# Patient Record
Sex: Male | Born: 1980 | Race: Black or African American | Hispanic: No | Marital: Single | Smoking: Current some day smoker
Health system: Southern US, Community
[De-identification: ages and names within clinical notes are randomized; demographics above are authoritative.]

## PROBLEM LIST (undated history)

## (undated) DIAGNOSIS — M130 Polyarthritis, unspecified: Secondary | ICD-10-CM

## (undated) DIAGNOSIS — L568 Other specified acute skin changes due to ultraviolet radiation: Secondary | ICD-10-CM

## (undated) DIAGNOSIS — M3500 Sicca syndrome, unspecified: Secondary | ICD-10-CM

## (undated) DIAGNOSIS — K1379 Other lesions of oral mucosa: Secondary | ICD-10-CM

## (undated) DIAGNOSIS — M329 Systemic lupus erythematosus, unspecified: Secondary | ICD-10-CM

## (undated) DIAGNOSIS — R5383 Other fatigue: Secondary | ICD-10-CM

## (undated) DIAGNOSIS — I319 Disease of pericardium, unspecified: Secondary | ICD-10-CM

## (undated) DIAGNOSIS — L659 Nonscarring hair loss, unspecified: Secondary | ICD-10-CM

## (undated) DIAGNOSIS — R21 Rash and other nonspecific skin eruption: Secondary | ICD-10-CM

## (undated) DIAGNOSIS — F909 Attention-deficit hyperactivity disorder, unspecified type: Secondary | ICD-10-CM

## (undated) DIAGNOSIS — J45909 Unspecified asthma, uncomplicated: Secondary | ICD-10-CM

## (undated) HISTORY — DX: Sjogren syndrome, unspecified: M35.00

## (undated) HISTORY — DX: Other fatigue: R53.83

## (undated) HISTORY — DX: Unspecified asthma, uncomplicated: J45.909

## (undated) HISTORY — DX: Other lesions of oral mucosa: K13.79

## (undated) HISTORY — DX: Other specified acute skin changes due to ultraviolet radiation: L56.8

## (undated) HISTORY — DX: Rash and other nonspecific skin eruption: R21

## (undated) HISTORY — DX: Nonscarring hair loss, unspecified: L65.9

## (undated) HISTORY — DX: Polyarthritis, unspecified: M13.0

---

## 2007-06-23 ENCOUNTER — Emergency Department (HOSPITAL_COMMUNITY): Admission: EM | Admit: 2007-06-23 | Discharge: 2007-06-23 | Payer: Self-pay | Admitting: Emergency Medicine

## 2009-04-09 IMAGING — CR DG FOOT COMPLETE 3+V*R*
3 series · 3 of 3 positions shown · non-contrast
Comparison: None.

CLINICAL DATA: 26-year-old male with penetrating trauma to the
right foot, glass.

RIGHT FOOT COMPLETE - 3+ VIEW

[view not recorded (1 of 3)]
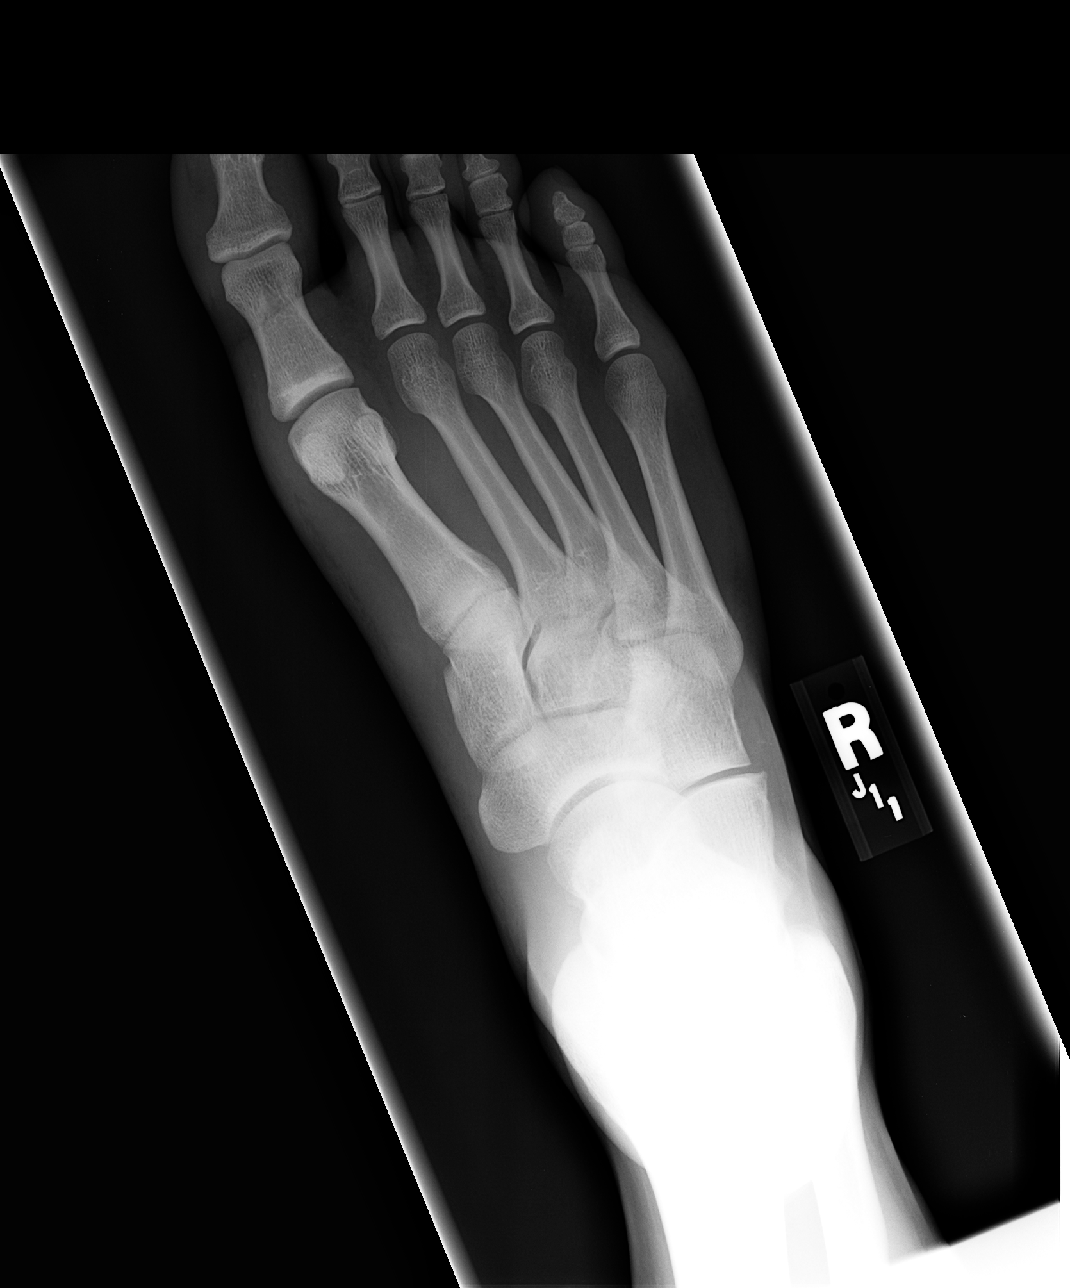

[view not recorded (2 of 3)]
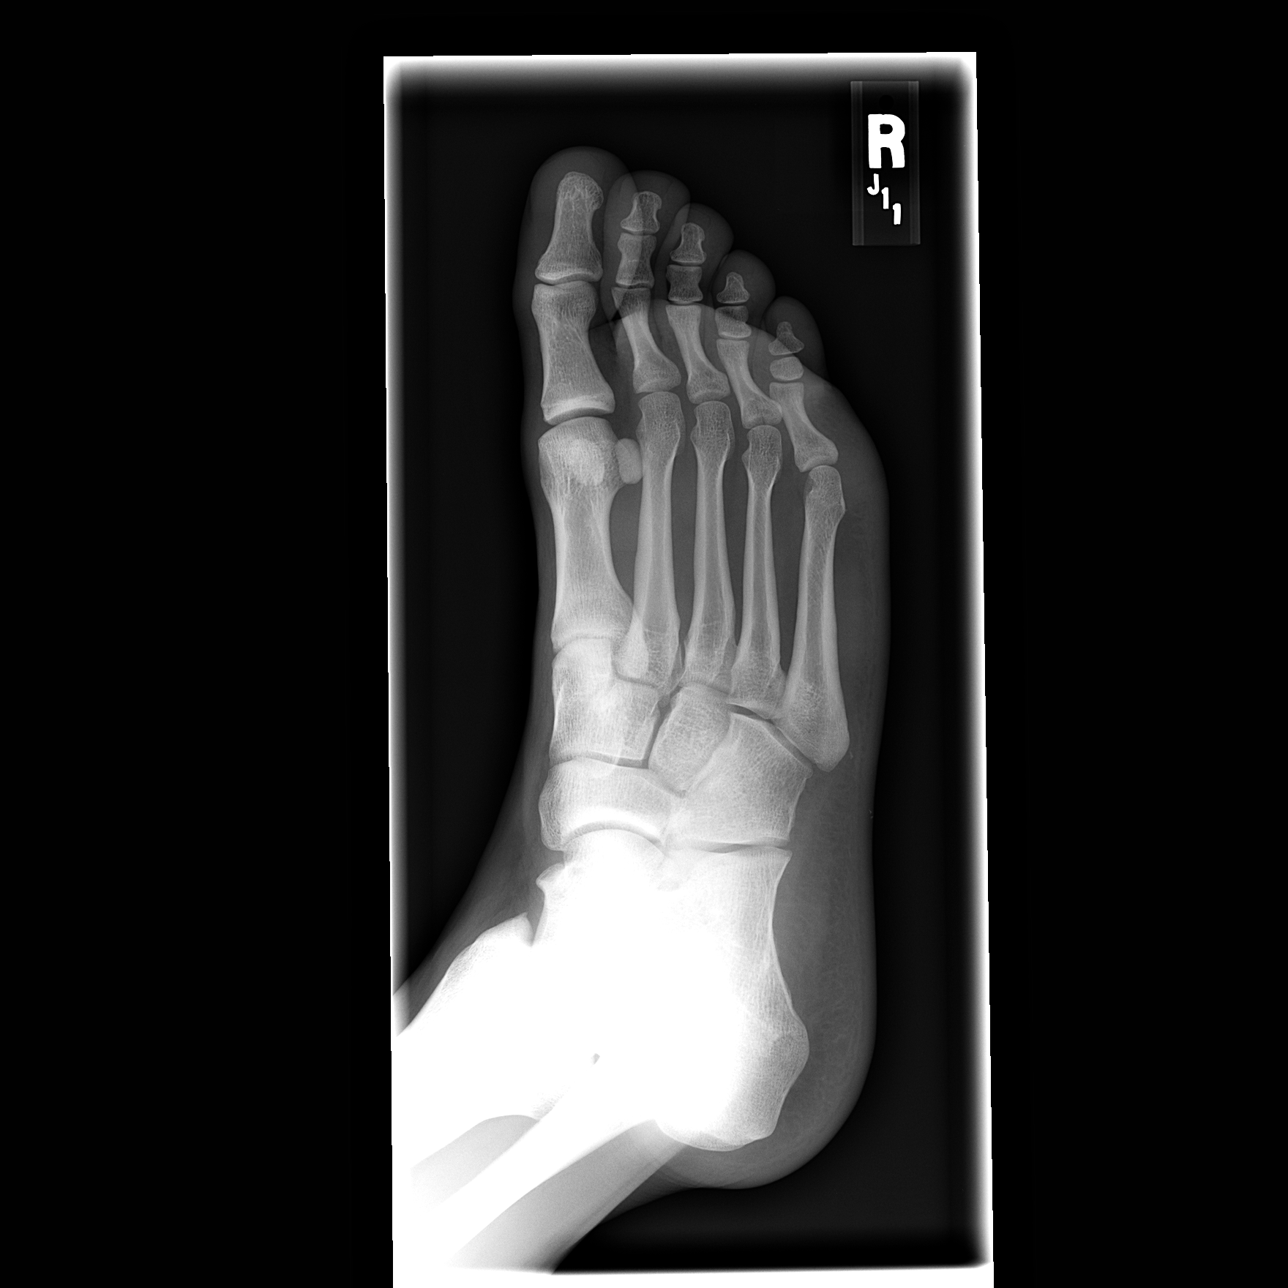

[view not recorded (3 of 3)]
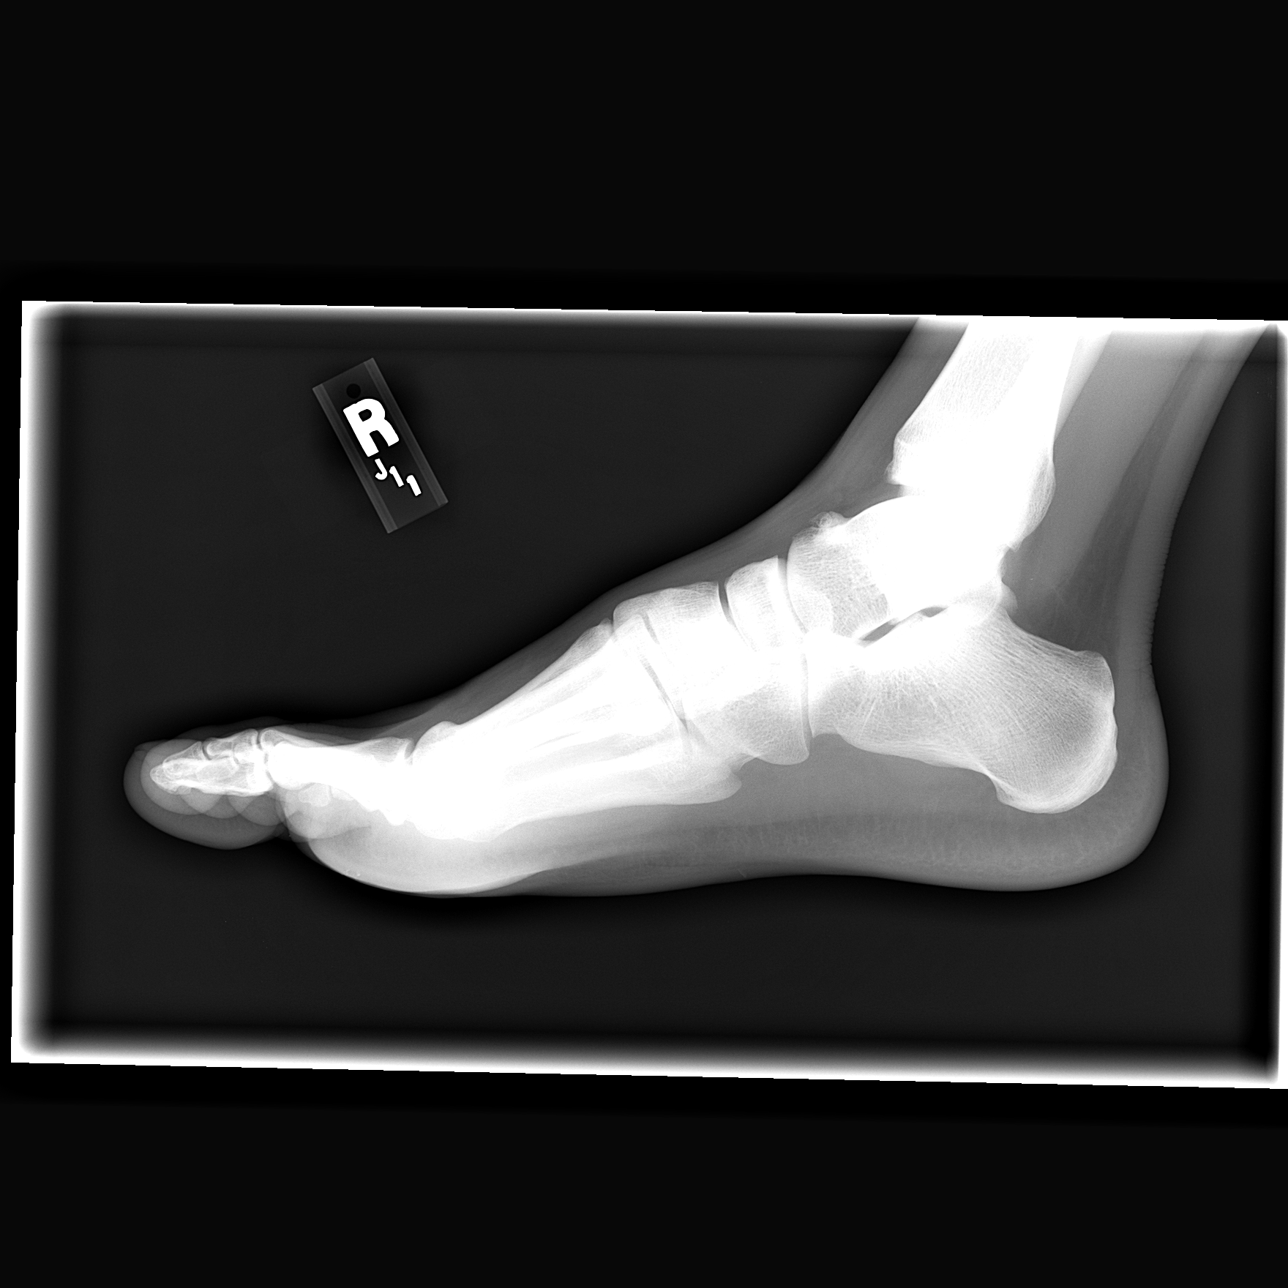

[3 of 3 positions shown; findings below may reference images not displayed]

FINDINGS: Soft tissue swelling with several punctate hyperdensities
seen just below the skin surface in the region of the plantar
surface of the forefoot on the lateral view (proximal phalangeal
level).  These densities are not clearly correlated on the AP or
oblique view.  Normal bone mineralization in the right foot. Joint
spaces are within normal limits.  No fracture or dislocation
identified.  Small accessory ossicle versus talar beaking seen on
the lateral view.  Grossly normal alignment about the right ankle.
IMPRESSION: 1.  Soft tissue swelling involving the plantar surface of the
forefoot in the region of the proximal phalanges with several
punctate hyperdensities just deep to the skin surface compatible
with small retained foreign bodies.
2. No acute fracture or dislocation identified about the right
foot.

## 2013-11-17 ENCOUNTER — Other Ambulatory Visit: Payer: Self-pay | Admitting: Nephrology

## 2013-11-17 DIAGNOSIS — N181 Chronic kidney disease, stage 1: Secondary | ICD-10-CM

## 2013-11-17 DIAGNOSIS — M329 Systemic lupus erythematosus, unspecified: Secondary | ICD-10-CM

## 2013-11-25 ENCOUNTER — Ambulatory Visit
Admission: RE | Admit: 2013-11-25 | Discharge: 2013-11-25 | Disposition: A | Discharge status: Home / Self Care | Payer: No Typology Code available for payment source | Source: Ambulatory Visit | Attending: Nephrology | Admitting: Nephrology

## 2013-11-25 DIAGNOSIS — M329 Systemic lupus erythematosus, unspecified: Secondary | ICD-10-CM

## 2013-11-25 DIAGNOSIS — N181 Chronic kidney disease, stage 1: Secondary | ICD-10-CM

## 2013-12-09 ENCOUNTER — Other Ambulatory Visit (HOSPITAL_COMMUNITY): Payer: Self-pay | Admitting: Nephrology

## 2013-12-09 DIAGNOSIS — M329 Systemic lupus erythematosus, unspecified: Secondary | ICD-10-CM

## 2013-12-12 ENCOUNTER — Ambulatory Visit (HOSPITAL_COMMUNITY): Payer: No Typology Code available for payment source

## 2013-12-21 ENCOUNTER — Other Ambulatory Visit: Payer: Self-pay | Admitting: Radiology

## 2013-12-22 ENCOUNTER — Ambulatory Visit (HOSPITAL_COMMUNITY)
Admission: RE | Admit: 2013-12-22 | Discharge: 2013-12-22 | Disposition: A | Discharge status: Home / Self Care | Payer: No Typology Code available for payment source | Source: Ambulatory Visit | Attending: Nephrology | Admitting: Nephrology

## 2013-12-22 ENCOUNTER — Encounter (HOSPITAL_COMMUNITY): Payer: Self-pay

## 2013-12-22 ENCOUNTER — Other Ambulatory Visit: Payer: Self-pay | Admitting: Interventional Radiology

## 2013-12-22 DIAGNOSIS — R319 Hematuria, unspecified: Secondary | ICD-10-CM | POA: Insufficient documentation

## 2013-12-22 DIAGNOSIS — R809 Proteinuria, unspecified: Secondary | ICD-10-CM | POA: Insufficient documentation

## 2013-12-22 DIAGNOSIS — Z79899 Other long term (current) drug therapy: Secondary | ICD-10-CM | POA: Insufficient documentation

## 2013-12-22 DIAGNOSIS — M329 Systemic lupus erythematosus, unspecified: Secondary | ICD-10-CM | POA: Insufficient documentation

## 2013-12-22 DIAGNOSIS — R808 Other proteinuria: Secondary | ICD-10-CM | POA: Diagnosis present

## 2013-12-22 HISTORY — DX: Systemic lupus erythematosus, unspecified: M32.9

## 2013-12-22 LAB — APTT: APTT: 26 s (ref 24–37)

## 2013-12-22 LAB — CBC
HEMATOCRIT: 37.5 % — AB (ref 39.0–52.0)
Hemoglobin: 12.5 g/dL — ABNORMAL LOW (ref 13.0–17.0)
MCH: 27.2 pg (ref 26.0–34.0)
MCHC: 33.3 g/dL (ref 30.0–36.0)
MCV: 81.7 fL (ref 78.0–100.0)
PLATELETS: 166 10*3/uL (ref 150–400)
RBC: 4.59 MIL/uL (ref 4.22–5.81)
RDW: 15.6 % — AB (ref 11.5–15.5)
WBC: 4.8 10*3/uL (ref 4.0–10.5)

## 2013-12-22 LAB — PROTIME-INR
INR: 0.99 (ref 0.00–1.49)
PROTHROMBIN TIME: 13.2 s (ref 11.6–15.2)

## 2013-12-22 MED ORDER — FENTANYL CITRATE 0.05 MG/ML IJ SOLN
INTRAMUSCULAR | Status: AC
  Filled 2013-12-22: qty 4

## 2013-12-22 MED ORDER — SODIUM CHLORIDE 0.9 % IV SOLN
INTRAVENOUS | Status: DC
  Administered 2013-12-22: 13:00:00 via INTRAVENOUS

## 2013-12-22 MED ORDER — FENTANYL CITRATE 0.05 MG/ML IJ SOLN
INTRAMUSCULAR | Status: AC | PRN
  Administered 2013-12-22: 50 ug via INTRAVENOUS

## 2013-12-22 MED ORDER — MIDAZOLAM HCL 2 MG/2ML IJ SOLN
INTRAMUSCULAR | Status: AC | PRN
  Administered 2013-12-22: 1 mg via INTRAVENOUS

## 2013-12-22 MED ORDER — LIDOCAINE HCL (PF) 1 % IJ SOLN
INTRAMUSCULAR | Status: AC
  Filled 2013-12-22: qty 10

## 2013-12-22 MED ORDER — MIDAZOLAM HCL 2 MG/2ML IJ SOLN
INTRAMUSCULAR | Status: AC
  Filled 2013-12-22: qty 4

## 2013-12-22 NOTE — Sedation Documentation (Signed)
Patient denies pain and is resting comfortably.  

## 2013-12-22 NOTE — Procedures (Signed)
Interventional Radiology Procedure Note  Procedure: US guided core biopsy of left kidney, Medical Renal for Hx of Lupus Complications: None Recommendations: - Bedrest supine x4hrs -  - Follow biopsy results  Signed,  Yvone NeuJaime S. Loreta AveWagner, DO

## 2013-12-22 NOTE — Discharge Instructions (Signed)
Kidney Biopsy, Care After °Refer to this sheet in the next few weeks. These instructions provide you with information on caring for yourself after your procedure. Your health care provider may also give you more specific instructions. Your treatment has been planned according to current medical practices, but problems sometimes occur. Call your health care provider if you have any problems or questions after your procedure.  °WHAT TO EXPECT AFTER THE PROCEDURE  °· You may notice blood in the urine for the first 24 hours after the biopsy. °· You may feel some pain at the biopsy site for 1-2 weeks after the biopsy. °HOME CARE INSTRUCTIONS °· Do not lift anything heavier than 10 lb (4.5 kg) for 2 weeks. °· Do not take any non-steroidal anti-inflammatory drugs (NSAIDs) or any blood thinners for a week after the biopsy unless instructed to do so by your health care provider. °· Only take medicines for pain, fever, or discomfort as directed by your health care provider. °SEEK MEDICAL CARE IF: °· You have bloody urine more than 24 hours after the biopsy.   °· You develop a fever.   °· You cannot urinate.   °· You have increasing pain at the biopsy site.   °SEEK IMMEDIATE MEDICAL CARE IF: °You feel faint or dizzy.  °Document Released: 09/03/2012 Document Reviewed: 09/03/2012 °ExitCare® Patient Information ©2015 [employer]. This information is not intended to replace advice given to you by your health care provider. Make sure you discuss any questions you have with your health care provider. ° °

## 2013-12-22 NOTE — H&P (Signed)
Chief Complaint: New diagnosis Systemic Lupus Erythematosus Proteinuria; hematuria  Referring Physician(s): Patel,Jay  History of Present Illness: Kurt Wright Ledbetter is a 33 y.o. male  Pt noted body aches; night sweats and hair loss 5 yrs ago Suspected systemic lupus erythematosus Blood work always negative for SLE until 10/2013 New diagnosis and noted hematuria and proteinuria Now scheduled for renal biopsy  Past Medical History  Diagnosis Date  . SLE (systemic lupus erythematosus)     History reviewed. No pertinent past surgical history.  Allergies: Codeine  Medications: Prior to Admission medications   Medication Sig Start Date End Date Taking? Authorizing Provider  ALPRAZolam Prudy Feeler(XANAX) 1 MG tablet Take 1 mg by mouth at bedtime as needed for anxiety.   Yes Historical Provider, MD  amphetamine-dextroamphetamine (ADDERALL) 30 MG tablet Take 1 tablet by mouth daily. 12/08/13  Yes Historical Provider, MD  cholecalciferol (VITAMIN D) 1000 UNITS tablet Take 1,000 Units by mouth daily.   Yes Historical Provider, MD  hydroxychloroquine (PLAQUENIL) 200 MG tablet Take 400 mg by mouth at bedtime.   Yes Historical Provider, MD  MAGNESIUM PO Take 1 tablet by mouth daily.   Yes Historical Provider, MD  predniSONE (DELTASONE) 20 MG tablet Take 20 mg by mouth daily with breakfast.  11/15/13  Yes Historical Provider, MD    History reviewed. No pertinent family history.  History   Social History  . Marital Status: Single    Spouse Name: N/A    Number of Children: N/A  . Years of Education: N/A   Social History Main Topics  . Smoking status: Never Smoker   . Smokeless tobacco: None  . Alcohol Use: None  . Drug Use: None  . Sexual Activity: None   Other Topics Concern  . None   Social History Narrative    Review of Systems: A 12 point ROS discussed and pertinent positives are indicated in the HPI above.  All other systems are negative.  Review of Systems  Constitutional:  Positive for fatigue. Negative for activity change and unexpected weight change.  HENT: Negative for trouble swallowing.   Respiratory: Negative for cough and shortness of breath.   Cardiovascular: Negative for chest pain.  Gastrointestinal: Negative for abdominal pain.  Genitourinary: Negative for difficulty urinating.  Musculoskeletal: Negative for joint swelling.  Neurological: Negative for weakness.  Psychiatric/Behavioral: Negative for behavioral problems and confusion.    Vital Signs: BP 138/92 mmHg  Pulse 93  Temp(Src) 98.1 F (36.7 C) (Oral)  Resp 18  Ht 5\' 6"  (1.676 Wright)  Wt 89.812 kg (198 lb)  BMI 31.97 kg/m2  SpO2 98%  Physical Exam  Constitutional: He is oriented to person, place, and time.  Cardiovascular: Normal rate and regular rhythm.   No murmur heard. Pulmonary/Chest: Effort normal and breath sounds normal. He has no wheezes.  Abdominal: Soft. Bowel sounds are normal. There is no tenderness.  Musculoskeletal: Normal range of motion.  Neurological: He is alert and oriented to person, place, and time.  Skin: Skin is warm and dry.  Psychiatric: He has a normal mood and affect. His behavior is normal. Judgment and thought content normal.  Nursing note and vitals reviewed.   Imaging: [facility]  11/25/2013   CLINICAL DATA:  Hypertension.  Lupus.  EXAM: RENAL/URINARY TRACT ULTRASOUND COMPLETE  COMPARISON:  None.  FINDINGS: Right Kidney:  Length: 12.3 cm. Echogenicity within normal limits. No mass or hydronephrosis visualized.  Left Kidney:  Length: 10.8 cm. Echogenicity within normal limits. No mass or hydronephrosis visualized.  Bladder:  Appears normal for degree of bladder distention.  IMPRESSION: Normal exam.   Electronically Signed   By: Maisie Fushomas  Register   On: 11/25/2013 10:07    Labs:  CBC:  Recent Labs  12/22/13 1250  WBC 4.8  HGB 12.5*  HCT 37.5*  PLT 166    COAGS: No results for input(s): INR, APTT in the last 8760 hours.  BMP: No results for  input(s): NA, K, CL, CO2, GLUCOSE, BUN, CALCIUM, CREATININE, GFRNONAA, GFRAA in the last 8760 hours.  Invalid input(s): CMP  LIVER FUNCTION TESTS: No results for input(s): BILITOT, AST, ALT, ALKPHOS, PROT, ALBUMIN in the last 8760 hours.  TUMOR MARKERS: No results for input(s): AFPTM, CEA, CA199, CHROMGRNA in the last 8760 hours.  Assessment and Plan:  SLE Hematuria/proteinuria Now scheduled for random renal biopsy Pt aware of procedure benefits and risks and agreeable to proceed Consent signed andin chart  Thank you for this interesting consult.  I greatly enjoyed meeting Kurt Wright Marchant and look forward to participating in their care.    I spent a total of 20 minutes face to face in clinical consultation, greater than 50% of which was counseling/coordinating care for random renal bx  Signed: Corinthia Helmers A 12/22/2013, 1:18 PM

## 2014-01-04 ENCOUNTER — Encounter (HOSPITAL_COMMUNITY): Payer: Self-pay

## 2014-05-10 ENCOUNTER — Encounter (HOSPITAL_COMMUNITY): Payer: Self-pay

## 2014-05-10 ENCOUNTER — Emergency Department (HOSPITAL_COMMUNITY): Payer: No Typology Code available for payment source

## 2014-05-10 ENCOUNTER — Observation Stay (HOSPITAL_COMMUNITY)
Admission: EM | Admit: 2014-05-10 | Discharge: 2014-05-10 | Disposition: A | Discharge status: Home / Self Care | Payer: No Typology Code available for payment source | Attending: Cardiology | Admitting: Cardiology

## 2014-05-10 DIAGNOSIS — M329 Systemic lupus erythematosus, unspecified: Secondary | ICD-10-CM | POA: Diagnosis present

## 2014-05-10 DIAGNOSIS — I309 Acute pericarditis, unspecified: Principal | ICD-10-CM | POA: Insufficient documentation

## 2014-05-10 DIAGNOSIS — R079 Chest pain, unspecified: Secondary | ICD-10-CM

## 2014-05-10 DIAGNOSIS — F909 Attention-deficit hyperactivity disorder, unspecified type: Secondary | ICD-10-CM | POA: Diagnosis not present

## 2014-05-10 DIAGNOSIS — Z885 Allergy status to narcotic agent status: Secondary | ICD-10-CM | POA: Diagnosis not present

## 2014-05-10 DIAGNOSIS — F17210 Nicotine dependence, cigarettes, uncomplicated: Secondary | ICD-10-CM | POA: Diagnosis not present

## 2014-05-10 DIAGNOSIS — I319 Disease of pericardium, unspecified: Secondary | ICD-10-CM | POA: Diagnosis not present

## 2014-05-10 HISTORY — DX: Attention-deficit hyperactivity disorder, unspecified type: F90.9

## 2014-05-10 HISTORY — DX: Disease of pericardium, unspecified: I31.9

## 2014-05-10 LAB — I-STAT TROPONIN, ED: TROPONIN I, POC: 0 ng/mL (ref 0.00–0.08)

## 2014-05-10 LAB — COMPREHENSIVE METABOLIC PANEL
ALT: 25 U/L (ref 0–53)
ANION GAP: 8 (ref 5–15)
AST: 26 U/L (ref 0–37)
Albumin: 4 g/dL (ref 3.5–5.2)
Alkaline Phosphatase: 46 U/L (ref 39–117)
BUN: 16 mg/dL (ref 6–23)
CO2: 27 mmol/L (ref 19–32)
CREATININE: 1.26 mg/dL (ref 0.50–1.35)
Calcium: 9.3 mg/dL (ref 8.4–10.5)
Chloride: 102 mmol/L (ref 96–112)
GFR calc Af Amer: 87 mL/min — ABNORMAL LOW (ref >90)
GFR calc non Af Amer: 75 mL/min — ABNORMAL LOW (ref >90)
GLUCOSE: 98 mg/dL (ref 70–99)
Potassium: 4.2 mmol/L (ref 3.5–5.1)
Sodium: 137 mmol/L (ref 135–145)
TOTAL PROTEIN: 7.8 g/dL (ref 6.0–8.3)
Total Bilirubin: 0.6 mg/dL (ref 0.3–1.2)

## 2014-05-10 LAB — CBC WITH DIFFERENTIAL/PLATELET
Basophils Absolute: 0 10*3/uL (ref 0.0–0.1)
Basophils Relative: 0 % (ref 0–1)
EOS ABS: 0.1 10*3/uL (ref 0.0–0.7)
Eosinophils Relative: 1 % (ref 0–5)
HEMATOCRIT: 45.4 % (ref 39.0–52.0)
HEMOGLOBIN: 15.6 g/dL (ref 13.0–17.0)
LYMPHS PCT: 10 % — AB (ref 12–46)
Lymphs Abs: 0.8 10*3/uL (ref 0.7–4.0)
MCH: 28.8 pg (ref 26.0–34.0)
MCHC: 34.4 g/dL (ref 30.0–36.0)
MCV: 83.9 fL (ref 78.0–100.0)
Monocytes Absolute: 0.5 10*3/uL (ref 0.1–1.0)
Monocytes Relative: 7 % (ref 3–12)
Neutro Abs: 6.1 10*3/uL (ref 1.7–7.7)
Neutrophils Relative %: 82 % — ABNORMAL HIGH (ref 43–77)
Platelets: 193 10*3/uL (ref 150–400)
RBC: 5.41 MIL/uL (ref 4.22–5.81)
RDW: 13.7 % (ref 11.5–15.5)
WBC: 7.4 10*3/uL (ref 4.0–10.5)

## 2014-05-10 LAB — C-REACTIVE PROTEIN: CRP: <0.5 mg/dL — ABNORMAL LOW (ref <0.60)

## 2014-05-10 LAB — TROPONIN I
Troponin I: <0.03 ng/mL (ref <0.031)
Troponin I: <0.03 ng/mL (ref <0.031)

## 2014-05-10 LAB — SEDIMENTATION RATE: SED RATE: 1 mm/h (ref 0–16)

## 2014-05-10 MED ORDER — ONDANSETRON HCL 4 MG/2ML IJ SOLN: 4.0000 mg | Freq: Four times a day (QID) | INTRAMUSCULAR | Status: DC | PRN

## 2014-05-10 MED ORDER — COLCHICINE 0.6 MG PO TABS: 0.6000 mg | ORAL_TABLET | Freq: Two times a day (BID) | ORAL | Status: DC

## 2014-05-10 MED ORDER — PANTOPRAZOLE SODIUM 40 MG PO TBEC: 40.0000 mg | DELAYED_RELEASE_TABLET | Freq: Two times a day (BID) | ORAL | Status: DC

## 2014-05-10 MED ORDER — ASPIRIN 81 MG PO CHEW
324.0000 mg | CHEWABLE_TABLET | Freq: Once | ORAL | Status: AC
  Administered 2014-05-10: 324 mg via ORAL
  Filled 2014-05-10: qty 4

## 2014-05-10 MED ORDER — MAGNESIUM 200 MG PO TABS: ORAL_TABLET | Freq: Every day | ORAL | Status: DC

## 2014-05-10 MED ORDER — ADULT MULTIVITAMIN W/MINERALS CH
1.0000 | ORAL_TABLET | Freq: Every day | ORAL | Status: DC
  Administered 2014-05-10: 1 via ORAL
  Filled 2014-05-10: qty 1

## 2014-05-10 MED ORDER — IBUPROFEN 600 MG PO TABS: 600.0000 mg | ORAL_TABLET | Freq: Three times a day (TID) | ORAL | Status: AC

## 2014-05-10 MED ORDER — COLCHICINE 0.6 MG PO TABS
0.6000 mg | ORAL_TABLET | Freq: Two times a day (BID) | ORAL | Status: DC
  Administered 2014-05-10: 0.6 mg via ORAL
  Filled 2014-05-10 (×2): qty 1

## 2014-05-10 MED ORDER — HYDROXYCHLOROQUINE SULFATE 200 MG PO TABS
400.0000 mg | ORAL_TABLET | Freq: Every day | ORAL | Status: DC
  Filled 2014-05-10: qty 2

## 2014-05-10 MED ORDER — KETOROLAC TROMETHAMINE 30 MG/ML IJ SOLN
30.0000 mg | Freq: Once | INTRAMUSCULAR | Status: AC
  Administered 2014-05-10: 30 mg via INTRAVENOUS
  Filled 2014-05-10: qty 1

## 2014-05-10 MED ORDER — PANTOPRAZOLE SODIUM 40 MG PO TBEC
40.0000 mg | DELAYED_RELEASE_TABLET | Freq: Two times a day (BID) | ORAL | Status: DC
  Administered 2014-05-10: 40 mg via ORAL
  Filled 2014-05-10: qty 1

## 2014-05-10 MED ORDER — MAGNESIUM OXIDE 400 (241.3 MG) MG PO TABS
200.0000 mg | ORAL_TABLET | Freq: Every day | ORAL | Status: DC
Start: 2014-05-10 — End: 2014-05-10
  Administered 2014-05-10: 200 mg via ORAL
  Filled 2014-05-10: qty 0.5

## 2014-05-10 MED ORDER — IBUPROFEN 600 MG PO TABS
600.0000 mg | ORAL_TABLET | Freq: Three times a day (TID) | ORAL | Status: DC
  Administered 2014-05-10: 600 mg via ORAL
  Filled 2014-05-10 (×3): qty 1

## 2014-05-10 MED ORDER — PREDNISONE 5 MG PO TABS
5.0000 mg | ORAL_TABLET | Freq: Every day | ORAL | Status: DC
  Filled 2014-05-10: qty 1

## 2014-05-10 MED ORDER — ACETAMINOPHEN 325 MG PO TABS: 650.0000 mg | ORAL_TABLET | ORAL | Status: DC | PRN

## 2014-05-10 NOTE — Progress Notes (Signed)
UR completed 

## 2014-05-10 NOTE — ED Notes (Signed)
Attempted to give report, pt not appropriate for 2 west.

## 2014-05-10 NOTE — ED Notes (Signed)
Pt states that around 0130 he awoke from his sleep to chest pain

## 2014-05-10 NOTE — Progress Notes (Signed)
Rhonda,PA paged @[phone] .[phone] and informed "Pt. With EKG changes since prev. 12 Lead done in ED, result reads acute MI/STEMI." No new orders given, advised RN to ensure troponin & sed rate drawn by phlebotomy. Pt. Denies CP, will continue to monitor.

## 2014-05-10 NOTE — Progress Notes (Signed)
Patient Name: Kurt Wright Date of Encounter: 05/10/2014     Active Problems:   Pericarditis    SUBJECTIVE  No further CP. Feeling well. Would be happy to go home today if ECHO stable.   CURRENT MEDS . colchicine  0.6 mg Oral BID  . hydroxychloroquine  400 mg Oral QHS  . ibuprofen  600 mg Oral TID  . magnesium oxide  200 mg Oral Daily  . multivitamin with minerals  1 tablet Oral Daily  . pantoprazole  40 mg Oral BID  . [START ON 05/11/2014] predniSONE  5 mg Oral Q breakfast    OBJECTIVE  Filed Vitals:   05/10/14 0715 05/10/14 0745 05/10/14 0842 05/10/14 0844  BP: 143/77 143/81 123/68   Pulse: 79 81 78   Temp:   98.5 F (36.9 C)   TempSrc:   Oral   Resp: 14 13 17    Height:      Weight:    211 lb 3.2 oz (95.8 kg)  SpO2: 98% 97% 98%    No intake or output data in the 24 hours ending 05/10/14 1100 Filed Weights   05/10/14 0429 05/10/14 0844  Weight: 208 lb (94.348 kg) 211 lb 3.2 oz (95.8 kg)    PHYSICAL EXAM  General: Pleasant, NAD. Neuro: Alert and oriented X 3. Moves all extremities spontaneously. Psych: Normal affect. HEENT:  Normal  Neck: Supple without bruits or JVD. Lungs:  Resp regular and unlabored, CTA. Heart: RRR no s3, s4, or murmurs. Abdomen: Soft, non-tender, non-distended, BS + x 4.  Extremities: No clubbing, cyanosis or edema. DP/PT/Radials 2+ and equal bilaterally.  Accessory Clinical Findings  CBC  Recent Labs  05/10/14 0440  WBC 7.4  NEUTROABS 6.1  HGB 15.6  HCT 45.4  MCV 83.9  PLT 498   Basic Metabolic Panel  Recent Labs  05/10/14 0440  NA 137  K 4.2  CL 102  CO2 27  GLUCOSE 98  BUN 16  CREATININE 1.26  CALCIUM 9.3   Liver Function Tests  Recent Labs  05/10/14 0440  AST 26  ALT 25  ALKPHOS 46  BILITOT 0.6  PROT 7.8  ALBUMIN 4.0    TELE  NSR with STE   Radiology/Studies  Dg Chest Port 1 View  05/10/2014   CLINICAL DATA:  Acute onset of centralized chest pain and pain with deep breaths. Initial  encounter.  EXAM: PORTABLE CHEST - 1 VIEW  COMPARISON:  None.  FINDINGS: The lungs are well-aerated and clear. There is no evidence of focal opacification, pleural effusion or pneumothorax.  The cardiomediastinal silhouette is borderline normal in size. No acute osseous abnormalities are seen.  IMPRESSION: No acute cardiopulmonary process seen.   Electronically Signed   By: Garald Balding M.D.   On: 05/10/2014 05:32    ASSESSMENT AND PLAN  32M with SLE who p/w CP c/w pericarditis  Acute pericarditis-  -- Troponin neg x1. Cycle troponins, serial ECGs -- TTE to r/o LVSD and effusion -- Continue Colchicine 0.6mg  BID and Ibuprofen 600mg  TID -- Continue High dose PPI with NSAIDS -- ESR/CRP pending -- Patient is followed by Dr. Amil Amen in rheumatology. He will call Dr. Burt Knack to discuss anti-inflammatories for pericarditis (i.e. NSAID in addition to steroids or steroids only).  -- Anticipate discharge later today if pain is controlled and labs and TTE look okay.   Judy Pimple PA-C  Pager [phone]  Patient seen, examined. Available data reviewed. Agree with findings, assessment, and plan as outlined  by Angelena Form, PA-C. The patient is a muscular, healthy-appearing African-American male. I have reviewed admission notes, EKG tracings, and lab work. His clinical scenario is consistent with acute or carditis. I agree with treatment plan of culture seen and ibuprofen. The patient is on a tapered dose of oral prednisone, currently taking 5 mg daily. He was advised to continue high-dose PPI. I would anticipate a very short course of nonsteroidal anti-inflammatory drugs to avoid toxicity (1 week of ibuprofen). Would continue colchicine for approximately 6 weeks. We are awaiting a 2-D echocardiogram to evaluate for pericardial effusion in this patient with lupus and pericarditis. As long as his echo looks okay, he can be discharged home today. He should have cardiology follow-up within 2  weeks.  Sherren Mocha, M.D. 05/10/2014 1:29 PM

## 2014-05-10 NOTE — Discharge Summary (Signed)
Discharge Summary   Patient ID: Kurt Wright MRN: [id number], DOB/AGE: 32/18/1982 31 y.o. Admit date: 05/10/2014 D/C date:     05/10/2014  Primary Cardiologist: New- Cooper  Principal Problem:   Pericarditis Active Problems:   SLE (systemic lupus erythematosus)   ADHD (attention deficit hyperactivity disorder)    Admission Dates: 05/10/14-05/10/14 Discharge Diagnosis: acute pericarditis  HPI: Kurt Wright is a 34 y.o. male with a history of SLE and ADHD who presented to [facility] on 05/10/14 with chest pain c/w acute pericarditis.   He was recently diagnosed with lupus in fall 2015 and on prednisone and plaquenil. At 1:30am, Kurt Wright was awoken by chest pain that he desribed as "sharp," "like a spasm," and "stabbing." Worse with inspiration and lying flat. He was most comfortable elevated at about 30-40 degrees. No dyspnea or LE edema. Pain 8-9/10 at its worst. Pain initially came on, subsided, and then returned. Because of these symptoms, he presented to the ER for evaluation.  He was hemodynamically stable on arrival. Labs were notable for POC TnI 0.00, Cr 1.26 (GFR 87), K 4.2 ECG demonstrated NSR. STE in inferior leads and anterior leads. Concave up. PR depression in II. PR elevation aVR. CXR without acute cardiopulmonary process. Of note, his rheumatologist has him on a  brief steroid pulse to treat arch swelling.    Hospital Course  Acute pericarditis-  -- Troponin neg x1. ECG demonstrated NSR. STE in inferior leads and anterior leads. Concave up. PR depression in II. PR elevation aVR. -- TTE with normal LV function; mild LVH; mild RAE/RVE; trace TR. No pericardial effusion. -- Continue Colchicine 0.75m BID for 6 weeks and Ibuprofen 6095mTID for 1 week. -- Continue high dose PPI with NSAIDS -- ESR/CRP pending -- The patient is on a tapered dose of oral prednisone, currently taking 5 mg daily. He can continue this and follow up with Dr. BeAmil Amens previously scheduled next  week.    The patient has had an uncomplicated hospital course and is recovering well. He has been seen by Dr. CoBurt Knackoday and deemed ready for discharge home. All follow-up appointments have been scheduled. Discharge medications are listed below.   Discharge Vitals: Blood pressure 123/68, pulse 78, temperature 98.5 F (36.9 C), temperature source Oral, resp. rate 17, height _0  (1.702 m), weight 211 lb 3.2 oz (95.8 kg), SpO2 98 %.  Labs: Lab Results  Component Value Date   WBC 7.4 05/10/2014   HGB 15.6 05/10/2014   HCT 45.4 05/10/2014   MCV 83.9 05/10/2014   PLT 193 05/10/2014     Recent Labs Lab 05/10/14 0440  NA 137  K 4.2  CL 102  CO2 27  BUN 16  CREATININE 1.26  CALCIUM 9.3  PROT 7.8  BILITOT 0.6  ALKPHOS 46  ALT 25  AST 26  GLUCOSE 98    Recent Labs  05/10/14 1148  TROPONINI <0.03    Diagnostic Studies/Procedures   Dg Chest Port 1 View  05/10/2014   CLINICAL DATA:  Acute onset of centralized chest pain and pain with deep breaths. Initial encounter.  EXAM: PORTABLE CHEST - 1 VIEW  COMPARISON:  None.  FINDINGS: The lungs are well-aerated and clear. There is no evidence of focal opacification, pleural effusion or pneumothorax.  The cardiomediastinal silhouette is borderline normal in size. No acute osseous abnormalities are seen.  IMPRESSION: No acute cardiopulmonary process seen.   Electronically Signed   By: JeGarald Balding.D.   On:  05/10/2014 05:32     2D ECHO: 05/10/2014 LV EF: 50- 55% Study Conclusions - Left ventricle: The cavity size was normal. Wall thickness was   increased in a pattern of mild LVH. Systolic function was normal.   The estimated ejection fraction was in the range of 50% to 55%.   Wall motion was normal; there were no regional wall motion   abnormalities. Left ventricular diastolic function parameters   were normal. - Right ventricle: The cavity size was mildly dilated. - Right atrium: The atrium was mildly  dilated. Impressions: - Normal LV function; mild LVH; mild RAE/RVE; trace TR.   Discharge Medications     Medication List    TAKE these medications        amphetamine-dextroamphetamine 30 MG tablet  Commonly known as:  ADDERALL  Take 1 tablet by mouth daily as needed (for concentration).     colchicine 0.6 MG tablet  Take 1 tablet (0.6 mg total) by mouth 2 (two) times daily.     hydroxychloroquine 200 MG tablet  Commonly known as:  PLAQUENIL  Take 400 mg by mouth at bedtime.     ibuprofen 600 MG tablet  Commonly known as:  ADVIL,MOTRIN  Take 1 tablet (600 mg total) by mouth 3 (three) times daily.     MAGNESIUM PO  Take 1 tablet by mouth daily.     multivitamin with minerals Tabs tablet  Take 1 tablet by mouth daily.     pantoprazole 40 MG tablet  Commonly known as:  PROTONIX  Take 1 tablet (40 mg total) by mouth 2 (two) times daily.     predniSONE 5 MG tablet  Commonly known as:  DELTASONE  Take 5 mg by mouth daily with breakfast.        Disposition   The patient will be discharged in stable condition to home.  Follow-up Information    Follow up with Truitt Merle, NP On 05/25/2014.   Specialty:  Nurse Practitioner   Why:  @ 10:30am   Contact information:   [address] [place] [address] [phone]         Duration of Discharge Encounter: Greater than 30 minutes including physician and PA time.  Mable Fill R PA-C 05/10/2014, 4:02 PM

## 2014-05-10 NOTE — ED Notes (Signed)
Pt sleeping. Notified pt's family of delay- waiting on new bed on floor and call back from MD

## 2014-05-10 NOTE — ED Notes (Signed)
Dr. Diona FantiKilroy is being paged in regards to pts bed status.

## 2014-05-10 NOTE — H&P (Signed)
Patient ID: Kurt Wright MRN: [id number], DOB/AGE: [birth year]   Admit date: 05/10/2014   Primary Physician: Salena Saner., MD Primary Cardiologist: None  Pt. Profile:  34M with SLE who p/w CP c/w pericarditis  Problem List  Past Medical History  Diagnosis Date  . SLE (systemic lupus erythematosus)     History reviewed. No pertinent past surgical history.   Allergies  Allergies  Allergen Reactions  . Codeine Hives    HPI  34M with recent diagnosis of lupus in fall 2015 on prednisone and plaquenil who p/w CP.  At 1:30am, Kurt Wright was awoken by chest pain that he desribed as "sharp," "like a spasm," and "stabbing." Worse with inspiration and lying flat. He was most comfortable elevated at about 30-40 degrees. No dyspnea or LE edema. Pain 8-9/10 at its worst. Pain initially come on, subsided, and has now returned. Because of these symptoms, he presented to the ER for evaluation.   He was hemodynamically stable on arrival. Labs were notable for POC TnI 0.00, Cr 1.26 (GFR 87), K 4.2 ECG demonstrated NSR. STE in inferior leads and anterior leads. Concave up. PR depression in II. PR elevation aVR. No priors for comparison. CXR without acute cardiopulmonary process.   Of note, his rheumatologist was going to do a brief steroid pulse to treat arch swelling. This has not started yet.   Home Medications  Prior to Admission medications   Medication Sig Start Date End Date Taking? Authorizing Provider  ALPRAZolam Duanne Moron) 1 MG tablet Take 1 mg by mouth at bedtime as needed for anxiety.    Historical Provider, MD  amphetamine-dextroamphetamine (ADDERALL) 30 MG tablet Take 1 tablet by mouth daily. 12/08/13   Historical Provider, MD  cholecalciferol (VITAMIN D) 1000 UNITS tablet Take 1,000 Units by mouth daily.    Historical Provider, MD  hydroxychloroquine (PLAQUENIL) 200 MG tablet Take 400 mg by mouth at bedtime.    Historical Provider, MD  MAGNESIUM PO Take 1 tablet by  mouth daily.    Historical Provider, MD  predniSONE (DELTASONE) 20 MG tablet Take 20 mg by mouth daily with breakfast.  11/15/13   Historical Provider, MD    Family History  No family history on file.  Social History  History   Social History  . Marital Status: Single    Spouse Name: N/A  . Number of Children: N/A  . Years of Education: N/A   Occupational History  . Not on file.   Social History Main Topics  . Smoking status: Current Some Day Smoker  . Smokeless tobacco: Not on file  . Alcohol Use: Yes     Comment: "rarely"   . Drug Use: No  . Sexual Activity: Not on file   Other Topics Concern  . Not on file   Social History Narrative     Review of Systems General:  No chills, fever, night sweats or weight changes.  Cardiovascular:  See HPI Dermatological: No rash, lesions/masses Respiratory: No cough, dyspnea Urologic: No hematuria, dysuria Abdominal:   No nausea, vomiting, diarrhea, bright red blood per rectum, melena, or hematemesis Neurologic:  No visual changes, wkns, changes in mental status. All other systems reviewed and are otherwise negative except as noted above.  Physical Exam  Blood pressure 139/91, pulse 89, temperature 98.1 F (36.7 C), temperature source Oral, resp. rate 22, height _0  (1.702 m), weight 94.348 kg (208 lb), SpO2 98 %.  General: Pleasant, NAD, muscular, multiple tatoos Psych: Normal affect. Neuro: Alert and  oriented X 3. Moves all extremities spontaneously. HEENT: Normal  Neck: Supple without bruits or JVD. Lungs:  Resp regular and unlabored, CTA. Heart: RRR no s3, s4, or murmurs. No rub.  Abdomen: Soft, non-tender, non-distended, BS + x 4.  Extremities: No clubbing, cyanosis or edema. DP/PT/Radials 2+ and equal bilaterally.  Labs  Troponin [facility] of Care Test)  Recent Labs  05/10/14 0442  TROPIPOC 0.00   No results for input(s): CKTOTAL, CKMB, TROPONINI in the last 72 hours. Lab Results  Component Value Date    WBC 7.4 05/10/2014   HGB 15.6 05/10/2014   HCT 45.4 05/10/2014   MCV 83.9 05/10/2014   PLT 193 05/10/2014   No results for input(s): NA, K, CL, CO2, BUN, CREATININE, CALCIUM, PROT, BILITOT, ALKPHOS, ALT, AST, GLUCOSE in the last 168 hours.  Invalid input(s): LABALBU No results found for: CHOL, HDL, LDLCALC, TRIG No results found for: DDIMER   Radiology/Studies  No results found.  ECG  NSR. STE in inferior leads and anterior leads. Concave up. PR depression in II. PR elevation aVR. No priors for comparison.  ASSESSMENT AND PLAN  29M with SLE who p/w CP and ECG c/w pericarditis.  1. Admit for monitoring 2. Cycle troponins, serial ECGs 3. TTE to r/o LVSD and effusion 4. Colchicine 0.1m BID 5. Ibuprofen 6072mTID 6. High dose PPI  7. ESR/CRP 8. Would contact patient's rheumatologist to discuss anti-inflammatories for pericarditis (i.e. NSAID in addition to steroids or steroids only).  9. Anticipate discharge later today if pain is controlled and labs and TTE look okay.    Signed, FRLamar SprinklesMD 05/10/2014, 5:02 AM

## 2014-05-10 NOTE — ED Notes (Signed)
Pt states he is having no chest pain.

## 2014-05-10 NOTE — ED Notes (Signed)
MD at bedside. 

## 2014-05-10 NOTE — Progress Notes (Signed)
Discharge instructions reviewed with Patient to include new medications and follow up appointment.  Patient voices understanding to teaching. Ambulatory to door.

## 2014-05-10 NOTE — Progress Notes (Signed)
Report received from [facility], ED RN

## 2014-05-10 NOTE — Discharge Instructions (Signed)
Pericarditis °Pericarditis is swelling (inflammation) of the pericardium. The pericardium is a thin, double-layered, fluid-filled tissue sac that surrounds the heart. The purpose of the pericardium is to contain the heart in the chest cavity and keep the heart from overexpanding. Different types of pericarditis can occur, such as: °· Acute pericarditis. Inflammation can develop suddenly in acute pericarditis. °· Chronic pericarditis. Inflammation develops gradually and is long-lasting in chronic pericarditis. °· Constrictive pericarditis. In this type of pericarditis, the layers of the pericardium stiffen and develop scar tissue. The scar tissue thickens and sticks together. This makes it difficult for the heart to pump and work as it normally does. °CAUSES  °Pericarditis can be caused from different conditions, such as: °· A bacterial, fungal or viral infection. °· After a heart attack (myocardial infarction). °· After open-heart surgery (coronary bypass graft surgery). °· Auto-immune conditions such as lupus, rheumatoid arthritis or scleroderma. °· Kidney failure. °· Low thyroid condition (hypothyroidism). °· Cancer from another part of the body that has spread (metastasized) to the pericardium. °· Chest injury or trauma. °· After radiation treatment. °· Certain medicines. °SYMPTOMS  °Symptoms of pericarditis can include: °· Chest pain. Chest pain symptoms may increase when laying down and may be relieved when sitting up and leaning forward. °· A chronic, dry cough. °· Heart palpitations. These may feel like rapid, fluttering or pounding heart beats. °· Chest pain may be worse when swallowing. °· Dizziness or fainting. °· Tiredness, fatigue or lethargy. °· Fever. °DIAGNOSIS  °Pericarditis is diagnosed by the following: °· A physical exam. A heart sound called a pericardial friction rub may be heard when your caregiver listens to your heart. °· Blood work. Blood may be drawn to check for an infection and to look at  your blood chemistry. °· Electrocardiography. During electrocardiography your heart's electrical activity is monitored and recorded with a tracing on paper (electrocardiogram [ECG]). °· Echocardiography. °· Computed tomography (CT). °· Magnetic resonance image (MRI). °TREATMENT  °To treat pericarditis, it is important to know the cause of it. The cause of pericarditis determines the treatment.  °· If the cause of pericarditis is due to an infection, treatment is based on the type of infection. If an infection is suspected in the pericardial fluid, a procedure called a pericardial fluid culture and biopsy may be done. This takes a sample of the pericardial fluid. The sample is sent to a lab which runs tests on the pericardial fluid to check for an infection. °· If the autoimmune disease is the cause, treatment of the autoimmune condition will help improve the pericarditis. °· If the cause of pericarditis is not known, anti-inflammatory medicines may be used to help decrease the inflammation. °· Surgery may be needed. The following are types of surgeries or procedures that may be done to treat pericarditis: °¨ Pericardial window. A pericardial window makes a cut (incision) into the pericardial sac. This allows excess fluid in the pericardium to drain. °¨ Pericardiocentesis. A pericardiocentesis is also known as a pericardial tap. This procedure uses a needle that is guided by X-ray to drain (aspirate) excess fluid from the pericardium. °¨ Pericardiectomy. A pericardiectomy removes part or all of the pericardium. °HOME CARE INSTRUCTIONS  °· Do not smoke. If you smoke, quit. Your caregiver can help you quit smoking. °· Maintain a healthy weight. °· Follow an exercise program as told by your caregiver. °· If you drink alcohol, do so in moderation. °· Eat a heart healthy diet. A registered dietician can help you learn about   healthy food choices. °· Keep a list of all your medicines with you at all times. Include the name,  dose, how often it is taken and how it is taken. °SEEK IMMEDIATE MEDICAL CARE IF:  °· You have chest pain or feelings of chest pressure. °· You have sweating (diaphoresis) when at rest. °· You have irregular heartbeats (palpitations). °· You have rapid, racing heart beats. °· You have unexplained fainting episodes. °· You feel sick to your stomach (nausea) or vomiting without cause. °· You have unexplained weakness. °If you develop any of the symptoms which originally made you seek care, call for local emergency medical help. Do not drive yourself to the hospital. °Document Released: 06/27/2000 Document Revised: 03/26/2011 Document Reviewed: 01/03/2011 °[employer]® Patient Information ©2015 [employer]. This information is not intended to replace advice given to you by your health care provider. Make sure you discuss any questions you have with your health care provider. ° °

## 2014-05-10 NOTE — ED Provider Notes (Signed)
CSN: [id number]     Arrival date & time 05/10/14  0421 History   First MD Initiated Contact with Patient 05/10/14 0430     Chief Complaint  Patient presents with  . Chest Pain     (Consider location/radiation/quality/duration/timing/severity/associated sxs/prior Treatment) HPI Patient has history of lupus. States he was in his normal health when he went to bed this evening. He woke at 1:30am with sharp substernal chest pain. He states the pain is worse with deep inspiration. Has been constant since 1:30 AM. States the pain is worse with movement and bending forward. No cough. No fever or chills. No lower extremity swelling or pain. Patient states he smokes several cigarettes a day. No history of early coronary artery disease in the immediate family. No extended travel or recent surgeries. Past Medical History  Diagnosis Date  . SLE (systemic lupus erythematosus)    History reviewed. No pertinent past surgical history. No family history on file. History  Substance Use Topics  . Smoking status: Current Some Day Smoker  . Smokeless tobacco: Not on file  . Alcohol Use: Yes     Comment: "rarely"     Review of Systems  Constitutional: Negative for fever and chills.  Respiratory: Negative for cough and shortness of breath.   Cardiovascular: Positive for chest pain. Negative for palpitations and leg swelling.  Gastrointestinal: Negative for nausea, vomiting and abdominal pain.  Musculoskeletal: Negative for back pain, neck pain and neck stiffness.  Skin: Negative for wound.  Neurological: Negative for dizziness, weakness, light-headedness and numbness.  All other systems reviewed and are negative.     Allergies  Codeine  Home Medications   Prior to Admission medications   Medication Sig Start Date End Date Taking? Authorizing Provider  amphetamine-dextroamphetamine (ADDERALL) 30 MG tablet Take 1 tablet by mouth daily as needed (for concentration).  12/08/13  Yes Historical  Provider, MD  hydroxychloroquine (PLAQUENIL) 200 MG tablet Take 400 mg by mouth at bedtime.   Yes Historical Provider, MD  MAGNESIUM PO Take 1 tablet by mouth daily.   Yes Historical Provider, MD  Multiple Vitamin (MULTIVITAMIN WITH MINERALS) TABS tablet Take 1 tablet by mouth daily.   Yes Historical Provider, MD  predniSONE (DELTASONE) 5 MG tablet Take 5 mg by mouth daily with breakfast.   Yes Historical Provider, MD   BP 136/92 mmHg  Pulse 84  Temp(Src) 98.1 F (36.7 C) (Oral)  Resp 18  Ht  (1.702 m)  Wt 208 lb (94.348 kg)  BMI 32.57 kg/m2  SpO2 98% Physical Exam  Constitutional: He is oriented to person, place, and time. He appears well-developed and well-nourished. No distress.  HENT:  Head: Normocephalic and atraumatic.  Mouth/Throat: Oropharynx is clear and moist.  Eyes: EOM are normal. Pupils are equal, round, and reactive to light.  Neck: Normal range of motion. Neck supple.  Cardiovascular: Normal rate and regular rhythm.  Exam reveals no gallop and no friction rub.   No murmur heard. Pulmonary/Chest: Effort normal and breath sounds normal. No respiratory distress. He has no wheezes. He has no rales. He exhibits no tenderness.  Abdominal: Soft. Bowel sounds are normal. He exhibits no distension and no mass. There is no tenderness. There is no rebound and no guarding.  Musculoskeletal: Normal range of motion. He exhibits no edema or tenderness.  Neurological: He is alert and oriented to person, place, and time.  Skin: Skin is warm and dry. No rash noted. No erythema.  Psychiatric: He has a normal mood  and affect. His behavior is normal.  Nursing note and vitals reviewed.   ED Course  Procedures (including critical care time) Labs Review Labs Reviewed  CBC WITH DIFFERENTIAL/PLATELET - Abnormal; Notable for the following:    Neutrophils Relative % 82 (*)    Lymphocytes Relative 10 (*)    All other components within normal limits  COMPREHENSIVE METABOLIC PANEL -  Abnormal; Notable for the following:    GFR calc non Af Amer 75 (*)    GFR calc Af Amer 87 (*)    All other components within normal limits  Rosezena SensorI-STAT TROPOININ, ED    Imaging Review Dg Chest Port 1 View  05/10/2014   CLINICAL DATA:  Acute onset of centralized chest pain and pain with deep breaths. Initial encounter.  EXAM: PORTABLE CHEST - 1 VIEW  COMPARISON:  None.  FINDINGS: The lungs are well-aerated and clear. There is no evidence of focal opacification, pleural effusion or pneumothorax.  The cardiomediastinal silhouette is borderline normal in size. No acute osseous abnormalities are seen.  IMPRESSION: No acute cardiopulmonary process seen.   Electronically Signed   By: Roanna RaiderJeffery  Chang M.D.   On: 05/10/2014 05:32     EKG Interpretation   Date/Time:  Monday May 10 2014 04:28:19 EDT Ventricular Rate:  91 PR Interval:  210 QRS Duration: 107 QT Interval:  356 QTC Calculation: 438 R Axis:   103 Text Interpretation:  Sinus rhythm Prolonged PR interval Borderline ST  elevation, anteror leads Confirmed by Ranae PalmsYELVERTON  MD, Tylasia Fletchall ([id number]) on  05/10/2014 5:28:50 AM      MDM   Final diagnoses:  Chest pain  Pericarditis   Issue with inferior ST segment elevation and mild anterior ST segment elevation. There are no reciprocal changes. There is diffuse PR depression.  Discussed with interventional as on call as well as the cardiology fellow. Both agree that the pattern and patient's symptoms consistent with pericarditis. Cardiology to admit. Initial troponin is normal. Discussed with patient and agrees with plan.     Loren Raceravid Bralynn Donado, MD 05/10/14 [phone]

## 2014-05-10 NOTE — Progress Notes (Signed)
  Echocardiogram 2D Echocardiogram has been performed.  Dessire Grimes FRANCES 05/10/2014, 2:24 PM

## 2014-05-10 NOTE — Progress Notes (Signed)
Attemped report.  RN unavailable.  Erenest Rasheraldwell,Zianna Dercole B, RN

## 2014-05-25 ENCOUNTER — Ambulatory Visit (INDEPENDENT_AMBULATORY_CARE_PROVIDER_SITE_OTHER): Payer: No Typology Code available for payment source | Admitting: Nurse Practitioner

## 2014-05-25 ENCOUNTER — Encounter: Payer: Self-pay | Admitting: Nurse Practitioner

## 2014-05-25 VITALS — BP 102/88 | HR 93 | Ht 67.0 in | Wt 215.4 lb

## 2014-05-25 DIAGNOSIS — I319 Disease of pericardium, unspecified: Secondary | ICD-10-CM | POA: Diagnosis not present

## 2014-05-25 NOTE — Patient Instructions (Addendum)
We will be checking the following labs today - NONE   Medication Instructions:    Continue with your current medicines.     Testing/Procedures To Be Arranged:  N/A  Follow-Up:   We will see you back as needed.     Other Special Instructions:   N/A  Call the [facility] office at [phone] if you have any questions, problems or concerns.

## 2014-05-25 NOTE — Progress Notes (Signed)
CARDIOLOGY OFFICE NOTE  Date:  05/25/2014    Kurt Stareonovan M Grammatico Date of Birth: 11/26/1980 Medical Record #[id number]  PCP:  Alva GarnetSHELTON,KIMBERLY R., MD  Cardiologist:  Excell Seltzerooper    Chief Complaint  Patient presents with  . Pericarditis    Post hospital visit - seen for Dr. Excell Seltzerooper     History of Present Illness: Kurt StareDonovan M Wright is a 34 y.o. male who presents today for a post hospital visit. He is seen for Dr. Excell Seltzerooper. He has a history of SLE and ADHD.  [facility] with chest pain c/w acute pericarditis. Troponin was negative. ECG demonstrated NSR. STE in inferior leads and anterior leads. Concave up. PR depression in II. PR elevation aVR. CXR without acute cardiopulmonary process. Of note, his rheumatologist has him on a brief steroid pulse to treat arch swelling.   He was recently diagnosed with lupus in fall 2015 and on prednisone and plaquenil.   He was treated with colchicine and ibuprofen.   Comes in today. Here with his mom. He is doing well. No more chest pain. Back to his usual activities. Not short of breath. Feels a lot better. Some stomach upset from the colchicine. He remains on his Plaquenil and has just started Cellcept. Remains on Prednisone. Finished his Ibuprofen.   Past Medical History  Diagnosis Date  . SLE (systemic lupus erythematosus)   . Pericarditis   . ADHD (attention deficit hyperactivity disorder)   . Asthma   . Fatigue   . Alopecia   . Photosensitivity   . Rash   . Mouth sores   . Polyarthritis   . Rash   . Sicca     No past surgical history on file.   Medications: Current Outpatient Prescriptions  Medication Sig Dispense Refill  . ALPRAZolam (XANAX) 1 MG tablet Take 1 mg by mouth at bedtime as needed for anxiety.    Marland Kitchen. amphetamine-dextroamphetamine (ADDERALL) 30 MG tablet Take 30 mg by mouth daily.     . colchicine 0.6 MG tablet Take 1 tablet (0.6 mg total) by mouth 2 (two) times daily. 60 tablet 1  .  hydroxychloroquine (PLAQUENIL) 200 MG tablet Take 400 mg by mouth daily.     . Magnesium 500 MG TABS Take 500 mg by mouth daily.    . Multiple Vitamin (MULTIVITAMIN WITH MINERALS) TABS tablet Take 1 tablet by mouth daily.    . mycophenolate (CELLCEPT) 500 MG tablet Take 500 mg by mouth 2 (two) times daily.     . predniSONE (DELTASONE) 10 MG tablet Take 10 mg by mouth daily with breakfast.    . Vitamin D, Ergocalciferol, (DRISDOL) 50000 UNITS CAPS capsule Take 50,000 Units by mouth every 7 (seven) days.    . pantoprazole (PROTONIX) 40 MG tablet Take 1 tablet (40 mg total) by mouth 2 (two) times daily. 14 tablet 0   No current facility-administered medications for this visit.    Allergies: Allergies  Allergen Reactions  . Codeine Hives    Social History: The patient  reports that he has been smoking.  He does not have any smokeless tobacco history on file. He reports that he drinks alcohol. He reports that he does not use illicit drugs.   Family History: The patient's family history is not on file. Mother is alive.   Review of Systems: Please see the history of present illness. He notes appetite change, joint swelling, fatigue and excessive sweating.   All other systems are reviewed and  negative.   Physical Exam: VS:  BP 102/88 mmHg  Pulse 93  Ht 5\' 7"  (1.702 m)  Wt 215 lb 6.4 oz (97.705 kg)  BMI 33.73 kg/m2  SpO2 97% .  BMI Body mass index is 33.73 kg/(m^2).  Wt Readings from Last 3 Encounters:  05/25/14 215 lb 6.4 oz (97.705 kg)  05/10/14 211 lb 3.2 oz (95.8 kg)  12/22/13 198 lb (89.812 kg)    General: Pleasant. Well developed, well nourished and in no acute distress. Very muscular.  HEENT: Normal. Neck: Supple, no JVD, carotid bruits, or masses noted.  Cardiac: Regular rate and rhythm. No murmurs, rubs, or gallops. No edema.  Respiratory:  Lungs are clear to auscultation bilaterally with normal work of breathing.  GI: Soft and nontender.  MS: No deformity or atrophy.  Gait and ROM intact. Skin: Warm and dry. Color is normal.  Neuro:  Strength and sensation are intact and no gross focal deficits noted.  Psych: Alert, appropriate and with normal affect.   LABORATORY DATA:  EKG:  EKG is ordered today. This shows NSR. Reviewed with Dr. Excell Seltzerooper.   Lab Results  Component Value Date   WBC 7.4 05/10/2014   HGB 15.6 05/10/2014   HCT 45.4 05/10/2014   PLT 193 05/10/2014   GLUCOSE 98 05/10/2014   ALT 25 05/10/2014   AST 26 05/10/2014   NA 137 05/10/2014   K 4.2 05/10/2014   CL 102 05/10/2014   CREATININE 1.26 05/10/2014   BUN 16 05/10/2014   CO2 27 05/10/2014   INR 0.99 12/22/2013    BNP (last 3 results) No results for input(s): BNP in the last 8760 hours.  ProBNP (last 3 results) No results for input(s): PROBNP in the last 8760 hours.   Other Studies Reviewed Today:  Echo Study Conclusions from 04/2014  - Left ventricle: The cavity size was normal. Wall thickness was increased in a pattern of mild LVH. Systolic function was normal. The estimated ejection fraction was in the range of 50% to 55%. Wall motion was normal; there were no regional wall motion abnormalities. Left ventricular diastolic function parameters were normal. - Right ventricle: The cavity size was mildly dilated. - Right atrium: The atrium was mildly dilated.  Impressions:  - Normal LV function; mild LVH; mild RAE/RVE; trace TR.   Assessment/Plan:  1. Acute pericarditis-  TTE with normal LV function; mild LVH; mild RAE/RVE; trace TR. No pericardial effusion. On Colchicine 0.6mg  BID for 6 weeks and has finished his Ibuprofen 600mg  TID for 1 week. He remains on prednisone as previously prescribed. His symptoms have totally resolved. See back prn.   2. SLE - followed by Dr. Dierdre ForthBeekman.   Current medicines are reviewed with the patient today.  The patient does not have concerns regarding medicines other than what has been noted above.  The following changes  have been made:  See above.  Labs/ tests ordered today include:    Orders Placed This Encounter  Procedures  . EKG 12-Lead     Disposition:   FU with Dr. Excell Seltzerooper prn.   Patient is agreeable to this plan and will call if any problems develop in the interim.   Signed: Rosalio MacadamiaLori C. Alexiana Laverdure, RN, ANP-C 05/25/2014 11:01 AM  [facility] [address] [place], [place]  [address] Phone: [phone] Fax: [phone]

## 2015-11-23 ENCOUNTER — Encounter: Payer: Self-pay | Admitting: Family Medicine

## 2015-11-23 ENCOUNTER — Ambulatory Visit (INDEPENDENT_AMBULATORY_CARE_PROVIDER_SITE_OTHER): Payer: BLUE CROSS/BLUE SHIELD | Admitting: Family Medicine

## 2015-11-23 VITALS — BP 122/88 | HR 100 | Temp 98.3°F | Resp 16

## 2015-11-23 DIAGNOSIS — M329 Systemic lupus erythematosus, unspecified: Secondary | ICD-10-CM | POA: Diagnosis not present

## 2015-11-23 DIAGNOSIS — F908 Attention-deficit hyperactivity disorder, other type: Secondary | ICD-10-CM | POA: Diagnosis not present

## 2015-11-23 DIAGNOSIS — M5126 Other intervertebral disc displacement, lumbar region: Secondary | ICD-10-CM | POA: Diagnosis not present

## 2015-11-23 MED ORDER — OXYCODONE HCL 10 MG PO TABS: ORAL_TABLET | ORAL | 0 refills | Status: DC

## 2015-11-23 NOTE — Progress Notes (Signed)
Kurt Wright  MRN: [id number] DOB: May 21, 1980  Subjective:  HPI  Patient is here to discuss back pain. 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 Exercise motion was to squat down and hold up both arms while holding a ball. All of a sudden he developed severe pain and was not able to walk since then. [facility] They did Xrays and MRI of the back and he was told to see spine specialist as soon as possible. He was given percocet and he has taking this with little relief. His legs feel weak and feel like the will give out. Pain is located in the lower back and is present with rest and movement.  No arm weakness or numbness. He does not recall any injury. He has had trouble with his back locking up a lot and has seen chiropractor for it but this is different.  Patient Active Problem List   Diagnosis Date Noted  . Pericarditis 05/10/2014  . ADHD (attention deficit hyperactivity disorder)   . SLE (systemic lupus erythematosus) (HCC)     Past Medical History:  Diagnosis Date  . ADHD (attention deficit hyperactivity disorder)   . Alopecia   . Asthma   . Fatigue   . Mouth sores   . Pericarditis   . Photosensitivity   . Polyarthritis   . Rash   . Rash   . Sicca (HCC)   . SLE (systemic lupus erythematosus) (HCC)     Social History   Social History  . Marital status: Single    Spouse name: N/A  . Number of children: N/A  . Years of education: N/A   Occupational History  . Not on file.   Social History Main Topics  . Smoking status: Current Some Day Smoker  . Smokeless tobacco: Never Used  . Alcohol use Yes     Comment: "rarely"   . Drug use: No  . Sexual activity: Not on file   Other Topics Concern  . Not on file   Social History Narrative  . No narrative on file    Outpatient Encounter Prescriptions as of 11/23/2015  Medication Sig Note  . amphetamine-dextroamphetamine (ADDERALL) 30 MG tablet Take  30 mg by mouth daily.  05/21/2014: Received from: External Pharmacy  . hydroxychloroquine (PLAQUENIL) 200 MG tablet Take 400 mg by mouth daily.    . mycophenolate (CELLCEPT) 500 MG tablet Take 500 mg by mouth 2 (two) times daily.    . predniSONE (DELTASONE) 10 MG tablet Take 10 mg by mouth daily with breakfast.   . [DISCONTINUED] ALPRAZolam (XANAX) 1 MG tablet Take 1 mg by mouth at bedtime as needed for anxiety.   . [DISCONTINUED] colchicine 0.6 MG tablet Take 1 tablet (0.6 mg total) by mouth 2 (two) times daily.   . [DISCONTINUED] Magnesium 500 MG TABS Take 500 mg by mouth daily.   . [DISCONTINUED] Multiple Vitamin (MULTIVITAMIN WITH MINERALS) TABS tablet Take 1 tablet by mouth daily.   . [DISCONTINUED] pantoprazole (PROTONIX) 40 MG tablet Take 1 tablet (40 mg total) by mouth 2 (two) times daily.   . [DISCONTINUED] Vitamin D, Ergocalciferol, (DRISDOL) 50000 UNITS CAPS capsule Take 50,000 Units by mouth every 7 (seven) days.    No facility-administered encounter medications on file as of 11/23/2015.     Allergies  Allergen Reactions  . Codeine Hives    Review of Systems  Constitutional:       Activity change  Respiratory: Negative.  Cardiovascular: Negative.   Musculoskeletal: Positive for back pain and myalgias.  Neurological: Positive for weakness.  Psychiatric/Behavioral: Negative.        Sleep disturbance    Objective:  BP 122/88   Pulse 100   Temp 98.3 F (36.8 C)   Resp 16   Physical Exam  Constitutional: He is oriented to person, place, and time and well-developed, well-nourished, and in no distress.  Muscular black male who is obviously in pain sitting in a wheelchair. He can find a comfortable position. Any movement causes severe pain.  HENT:  Head: Normocephalic and atraumatic.  Eyes: Conjunctivae are normal.  Cardiovascular: Normal rate, regular rhythm and normal heart sounds.   Pulmonary/Chest: Effort normal and breath sounds normal.  Abdominal: Soft.    Neurological: He is alert and oriented to person, place, and time. No cranial nerve deficit. He exhibits normal muscle tone. Coordination normal.  Patient is uncomfortable sitting in a wheelchair. Neurosensory exam of lower extremities is normal. Straight leg raise is positive on the left.  Skin: Skin is warm and dry.  Psychiatric: Mood, memory, affect and judgment normal.    Assessment and Plan :  1. Lumbar herniated disc/Acute back pain MRI reveals central disc protrusion at L4-5 with bilateral foraminal stenosis and impingement of the exiting left and right L4 nerve root. Due to the amount of spasm the patient is having his lower back I cannot determine how much his spasm versus how much his radiculopathy. Will defer to their expertise of Dr. Lovell SheehanJenkins. He is starting on a low-dose of prednisone for his lupus. Percocet is giving him a little relief with this pain. May need to increase this. He admits that as long as he is lying flat in the bed he is comfortable. This very well may be all muscular and I have discussed this with patient and his girlfriend and mother and father. Patient already on prednisone so NSAID will be of no help. - Ambulatory referral to Neurosurgery  2. Systemic lupus erythematosus, unspecified SLE type, unspecified organ involvement status (HCC)   3. Attention deficit hyperactivity disorder (ADHD), other type

## 2015-11-24 ENCOUNTER — Emergency Department: Payer: Self-pay

## 2015-11-24 ENCOUNTER — Emergency Department (HOSPITAL_BASED_OUTPATIENT_CLINIC_OR_DEPARTMENT_OTHER)
Admit: 2015-11-24 | Discharge: 2015-11-24 | Disposition: A | Discharge status: Home / Self Care | Payer: BLUE CROSS/BLUE SHIELD

## 2015-11-24 ENCOUNTER — Encounter (HOSPITAL_COMMUNITY): Payer: Self-pay | Admitting: *Deleted

## 2015-11-24 ENCOUNTER — Emergency Department (HOSPITAL_COMMUNITY)
Admission: EM | Admit: 2015-11-24 | Discharge: 2015-11-24 | Disposition: A | Discharge status: Home / Self Care | Payer: BLUE CROSS/BLUE SHIELD | Attending: Emergency Medicine | Admitting: Emergency Medicine

## 2015-11-24 DIAGNOSIS — M79609 Pain in unspecified limb: Secondary | ICD-10-CM

## 2015-11-24 DIAGNOSIS — I82402 Acute embolism and thrombosis of unspecified deep veins of left lower extremity: Secondary | ICD-10-CM | POA: Insufficient documentation

## 2015-11-24 DIAGNOSIS — Z79899 Other long term (current) drug therapy: Secondary | ICD-10-CM | POA: Insufficient documentation

## 2015-11-24 DIAGNOSIS — J45909 Unspecified asthma, uncomplicated: Secondary | ICD-10-CM | POA: Diagnosis not present

## 2015-11-24 DIAGNOSIS — I82442 Acute embolism and thrombosis of left tibial vein: Secondary | ICD-10-CM

## 2015-11-24 DIAGNOSIS — F17200 Nicotine dependence, unspecified, uncomplicated: Secondary | ICD-10-CM | POA: Diagnosis not present

## 2015-11-24 DIAGNOSIS — M545 Low back pain, unspecified: Secondary | ICD-10-CM

## 2015-11-24 DIAGNOSIS — F909 Attention-deficit hyperactivity disorder, unspecified type: Secondary | ICD-10-CM | POA: Insufficient documentation

## 2015-11-24 DIAGNOSIS — M79605 Pain in left leg: Secondary | ICD-10-CM | POA: Diagnosis present

## 2015-11-24 LAB — URINALYSIS, ROUTINE W REFLEX MICROSCOPIC
BILIRUBIN URINE: NEGATIVE
Glucose, UA: NEGATIVE mg/dL
KETONES UR: NEGATIVE mg/dL
LEUKOCYTES UA: NEGATIVE
NITRITE: NEGATIVE
PH: 5.5 (ref 5.0–8.0)
Protein, ur: 100 mg/dL — AB
Specific Gravity, Urine: 1.011 (ref 1.005–1.030)

## 2015-11-24 LAB — URINE MICROSCOPIC-ADD ON

## 2015-11-24 MED ORDER — RIVAROXABAN (XARELTO) VTE STARTER PACK (15 & 20 MG): ORAL_TABLET | ORAL | 0 refills | Status: DC

## 2015-11-24 MED ORDER — RIVAROXABAN (XARELTO) EDUCATION KIT FOR DVT/PE PATIENTS
PACK | Freq: Once | Status: AC
  Administered 2015-11-24: 20:00:00
  Filled 2015-11-24 (×2): qty 1

## 2015-11-24 MED ORDER — OXYCODONE-ACETAMINOPHEN 5-325 MG PO TABS: 1.0000 | ORAL_TABLET | ORAL | 0 refills | Status: DC | PRN

## 2015-11-24 MED ORDER — RIVAROXABAN 20 MG PO TABS: 20.0000 mg | ORAL_TABLET | Freq: Every day | ORAL | 0 refills | Status: AC

## 2015-11-24 MED ORDER — METHOCARBAMOL 500 MG PO TABS: 500.0000 mg | ORAL_TABLET | Freq: Three times a day (TID) | ORAL | 0 refills | Status: AC | PRN

## 2015-11-24 MED ORDER — HYDROMORPHONE HCL 2 MG/ML IJ SOLN
1.0000 mg | Freq: Once | INTRAMUSCULAR | Status: AC
  Administered 2015-11-24: 1 mg via INTRAMUSCULAR
  Filled 2015-11-24: qty 1

## 2015-11-24 MED ORDER — RIVAROXABAN 15 MG PO TABS: 15.0000 mg | ORAL_TABLET | Freq: Two times a day (BID) | ORAL | 0 refills | Status: DC

## 2015-11-24 MED ORDER — METHOCARBAMOL 500 MG PO TABS
500.0000 mg | ORAL_TABLET | Freq: Once | ORAL | Status: AC
  Administered 2015-11-24: 500 mg via ORAL
  Filled 2015-11-24: qty 1

## 2015-11-24 MED ORDER — RIVAROXABAN 15 MG PO TABS
15.0000 mg | ORAL_TABLET | Freq: Once | ORAL | Status: AC
  Administered 2015-11-24: 15 mg via ORAL
  Filled 2015-11-24: qty 1

## 2015-11-24 MED ORDER — RIVAROXABAN 15 MG PO TABS: 15.0000 mg | ORAL_TABLET | Freq: Two times a day (BID) | ORAL | Status: DC

## 2015-11-24 MED ORDER — IBUPROFEN 400 MG PO TABS: 600.0000 mg | ORAL_TABLET | Freq: Once | ORAL | Status: DC

## 2015-11-24 MED ORDER — KETOROLAC TROMETHAMINE 60 MG/2ML IM SOLN
60.0000 mg | Freq: Once | INTRAMUSCULAR | Status: AC
  Administered 2015-11-24: 60 mg via INTRAMUSCULAR
  Filled 2015-11-24: qty 2

## 2015-11-24 MED ORDER — PREDNISONE 20 MG PO TABS
60.0000 mg | ORAL_TABLET | Freq: Once | ORAL | Status: AC
  Administered 2015-11-24: 60 mg via ORAL
  Filled 2015-11-24: qty 3

## 2015-11-24 NOTE — Progress Notes (Signed)
Preliminary results by tech - Left Lower Ext. Venous Duplex Completed. Positive for acute deep vein thrombosis involving the posterior tibial and peroneal veins. [facility]. Marilynne Halstedita Kashten Gowin, BS, RDMS, RVT

## 2015-11-24 NOTE — ED Notes (Signed)
Patient transported to Ultrasound for venous doppler

## 2015-11-24 NOTE — ED Provider Notes (Signed)
MSE was initiated and I personally evaluated and placed orders (if any) at 12:38 PM on 11/24/2015   The patient appears stable so that the remainder of the MSE may be completed by another provider.   Subjective:  The history is provided by the patient. No language interpreter was used.  Kurt Wright is a 35 y.o. male with past medical history of systemic lupus who presents to the Emergency Department complaining of 1 week of stabbing left calf pain with associated swelling. He denies known trauma or injuries to the affected area. He has tried massages and foam rolling without relief. He denies recent long distance travel, history of CA, or hormone replacement use. He reports occasionally using cigarettes.   He also complains of back pain for the last week. He reports collapsing in the gym due an episode of back pain. Per family, he [facility] which shows nerve root impingement and hernia. He plans to follow up with a neurosurgeon but has not secured an appointment yet. He is taking Percocet BID for pain control. He denies CP, SOB.    Objective:  Constitutional: Pt is oriented to person, place, and time. Pt appears well-developed and well-nourished. No distress.  HENT:  Head: Normocephalic and atraumatic.  Eyes: Conjunctivae are normal.  Cardiovascular: Normal rate.  Pulmonary/Chest: Effort normal.  Abdominal: Pt exhibits no distension.  Musculo: Tenderness to palpation of the left calf along the posterior aspect. No significant edema. No redness or warmth to touch.  Neurological: Pt is alert and oriented to person, place, and time.  Skin: Skin is warm and dry.  Psychiatric: Pt has a normal mood and affect.  Nursing note and vitals reviewed.    Assessment and Plan:  Concern for DVT. Placed initial orders and will have pt assessed in higher acuity department.   By signing my name below, I, Sonum Patel, attest that this documentation has been prepared under the  direction and in the presence of non-physician practitioner, Eyvonne MechanicJeffrey Sabino Denning, PA-C Electronically Signed: Sonum Patel, Neurosurgeoncribe. 11/24/2015. 12:50 PM.   I personally performed the services described in this documentation, which was scribed in my presence. The recorded information has been reviewed and is accurate.    Eyvonne MechanicJeffrey Gracielynn Birkel, PA-C 11/24/15 1607    Marily MemosJason Mesner, MD 11/26/15 1034

## 2015-11-24 NOTE — ED Triage Notes (Signed)
PT sitting up in bed eating . Family at bedside.

## 2015-11-24 NOTE — ED Triage Notes (Signed)
Pt states last week he fell d/t back pain.  [place].  Also c/o L calf pain since before fall.  Pain meds and muscle relaxers have given no relief.

## 2015-11-24 NOTE — ED Provider Notes (Signed)
[facility] DEPT Provider Note   CSN: [id number] Arrival date & time: 11/24/15  1150     History   Chief Complaint Chief Complaint  Patient presents with  . Back Pain  . Leg Pain    HPI Kurt Wright is a 35 y.o. male.  35 year old male who had a ruptured disc on the seventh seen on MRI in [place] has had worsening pain since that time with intermittent very saddle anesthesia but nothing currently. Has had multi-walking at times. Improved with Percocet and Percocet wears off and is only taken every 12 hours. Has been urinating more often than normal. No blood in his urine. Has appointment with neurosurgery and couple weeks but pain got so bad tonight came here for evaluation.      Past Medical History:  Diagnosis Date  . ADHD (attention deficit hyperactivity disorder)   . Alopecia   . Asthma   . Fatigue   . Mouth sores   . Pericarditis   . Photosensitivity   . Polyarthritis   . Rash   . Rash   . Sicca ([place])   . SLE (systemic lupus erythematosus) ([place])     Patient Active Problem List   Diagnosis Date Noted  . Pericarditis 05/10/2014  . ADHD (attention deficit hyperactivity disorder)   . SLE (systemic lupus erythematosus) ([place])     History reviewed. No pertinent surgical history.     Home Medications    Prior to Admission medications   Medication Sig Start Date End Date Taking? Authorizing Provider  amphetamine-dextroamphetamine (ADDERALL) 30 MG tablet Take 30 mg by mouth daily.  04/23/14   Historical Provider, MD  hydroxychloroquine (PLAQUENIL) 200 MG tablet Take 400 mg by mouth daily.     Historical Provider, MD  methocarbamol (ROBAXIN) 500 MG tablet Take 1 tablet (500 mg total) by mouth every 8 (eight) hours as needed for muscle spasms. 11/24/15   Merrily Pew, MD  mycophenolate (CELLCEPT) 500 MG tablet Take 500 mg by mouth 2 (two) times daily.     Historical Provider, MD  Oxycodone HCl 10 MG TABS 1 to 2 tablets every 3 hours as needed 11/23/15    Jerrol Banana., MD  oxyCODONE-acetaminophen (PERCOCET/ROXICET) 5-325 MG tablet Take 1-2 tablets by mouth every 4 (four) hours as needed for severe pain. 11/24/15   Merrily Pew, MD  predniSONE (DELTASONE) 10 MG tablet Take 10 mg by mouth daily with breakfast.    Historical Provider, MD  Rivaroxaban (XARELTO) 15 MG TABS tablet Take 1 tablet (15 mg total) by mouth 2 (two) times daily. 11/24/15 12/15/15  Merrily Pew, MD  rivaroxaban (XARELTO) 20 MG TABS tablet Take 1 tablet (20 mg total) by mouth daily with supper. 12/16/15   Merrily Pew, MD  Rivaroxaban 15 & 20 MG TBPK Take as directed on package: Start with one '15mg'$  tablet by mouth twice a day with food. On Day 22, switch to one '20mg'$  tablet once a day with food. 11/24/15   Merrily Pew, MD    Family History No family history on file.  Social History Social History  Substance Use Topics  . Smoking status: Current Some Day Smoker  . Smokeless tobacco: Never Used  . Alcohol use Yes     Comment: "rarely"      Allergies   Codeine   Review of Systems Review of Systems  Musculoskeletal: Positive for back pain.  All other systems reviewed and are negative.    Physical Exam Updated Vital Signs BP 132/76 (BP  Location: Right Arm)   Pulse 75   Temp 98.5 F (36.9 C) (Oral)   Resp 18   Ht '5\' 7"'$  (1.702 m)   Wt 215 lb (97.5 kg)   SpO2 98%   BMI 33.67 kg/m   Physical Exam  Constitutional: He is oriented to person, place, and time. He appears well-developed and well-nourished.  HENT:  Head: Normocephalic and atraumatic.  Eyes: Conjunctivae and EOM are normal.  Neck: Normal range of motion.  Cardiovascular: Normal rate.   Pulmonary/Chest: Effort normal. No respiratory distress. He has no wheezes.  Abdominal: Soft. He exhibits no distension. There is no tenderness.  Musculoskeletal: Normal range of motion.  Neurological: He is alert and oriented to person, place, and time.  Skin: Skin is warm and dry. No rash noted.  Nursing  note and vitals reviewed.    ED Treatments / Results  Labs (all labs ordered are listed, but only abnormal results are displayed) Labs Reviewed  URINALYSIS, ROUTINE W REFLEX MICROSCOPIC (NOT AT [facility]) - Abnormal; Notable for the following:       Result Value   Hgb urine dipstick MODERATE (*)    Protein, ur 100 (*)    All other components within normal limits  URINE MICROSCOPIC-ADD ON - Abnormal; Notable for the following:    Squamous Epithelial / LPF 0-5 (*)    Bacteria, UA RARE (*)    All other components within normal limits  URINE CULTURE    EKG  EKG Interpretation None       Radiology No results found.  Procedures Procedures (including critical care time)  Medications Ordered in ED Medications  Rivaroxaban (XARELTO) tablet 15 mg (not administered)  predniSONE (DELTASONE) tablet 60 mg (60 mg Oral Given 11/24/15 1642)  ketorolac (TORADOL) injection 60 mg (60 mg Intramuscular Given 11/24/15 1643)  HYDROmorphone (DILAUDID) injection 1 mg (1 mg Intramuscular Given 11/24/15 1644)  HYDROmorphone (DILAUDID) injection 1 mg (1 mg Intramuscular Given 11/24/15 1817)  methocarbamol (ROBAXIN) tablet 500 mg (500 mg Oral Given 11/24/15 1815)  Rivaroxaban (XARELTO) tablet 15 mg (15 mg Oral Given 11/24/15 2003)  rivaroxaban [facility] [employer]) Education Kit for DVT/PE patients ( Does not apply Given 11/24/15 2003)     Initial Impression / Assessment and Plan / ED Course  I have reviewed the triage vital signs and the nursing notes.  Pertinent labs & imaging results that were available during my care of the patient were reviewed by me and considered in my medical decision making (see chart for details).  Clinical Course     Pain improved with IM medications and muscle relaxers, will dc on same. Spoke with Dr. John Giovanni who will try to get him an appt sooner. MRI uploaded for his perusal.  Also with new DVT. Has recent labs with GFR will above 30, will start xarelto, starter pack Rx.   Final  Clinical Impressions(s) / ED Diagnoses   Final diagnoses:  Acute deep vein thrombosis (DVT) of tibial vein of left lower extremity (HCC)  Acute bilateral low back pain without sciatica    New Prescriptions Discharge Medication List as of 11/24/2015  8:09 PM    START taking these medications   Details  methocarbamol (ROBAXIN) 500 MG tablet Take 1 tablet (500 mg total) by mouth every 8 (eight) hours as needed for muscle spasms., Starting Thu 11/24/2015, Print    oxyCODONE-acetaminophen (PERCOCET/ROXICET) 5-325 MG tablet Take 1-2 tablets by mouth every 4 (four) hours as needed for severe pain., Starting Thu 11/24/2015, Print    !!  Rivaroxaban (XARELTO) 15 MG TABS tablet Take 1 tablet (15 mg total) by mouth 2 (two) times daily., Starting Thu 11/24/2015, Until Thu 12/15/2015, Print    !! rivaroxaban (XARELTO) 20 MG TABS tablet Take 1 tablet (20 mg total) by mouth daily with supper., Starting Fri 12/16/2015, Print    Rivaroxaban 15 & 20 MG TBPK Take as directed on package: Start with one '15mg'$  tablet by mouth twice a day with food. On Day 22, switch to one '20mg'$  tablet once a day with food., Print     !! - Potential duplicate medications found. Please discuss with provider.       Merrily Pew, MD 11/24/15 2129

## 2015-11-24 NOTE — ED Notes (Signed)
Pt will be picked up next for US.  Pt notified.  Pt is with his family

## 2015-11-24 NOTE — Discharge Instructions (Signed)
Please call neurosurgery, let them know you were in ED and need appointment sooner. On call neurosurgeon should also make that request as well.

## 2015-11-24 NOTE — ED Notes (Signed)
Called US to get an update on when pt will be picked up for Venous US.

## 2015-11-24 NOTE — ED Notes (Signed)
DVT was positive.  Pt is back in waiting area, no distress

## 2015-11-24 NOTE — ED Notes (Signed)
Waiting for pharmacy to do xarelto instructions

## 2015-11-25 LAB — URINE CULTURE: Culture: NO GROWTH

## 2016-02-25 IMAGING — CR DG CHEST 1V PORT
1 series · 1 of 1 positions shown · non-contrast
Comparison: None.

CLINICAL DATA: Acute onset of centralized chest pain and pain with
deep breaths. Initial encounter.

EXAM:
PORTABLE CHEST - 1 VIEW

[AP]
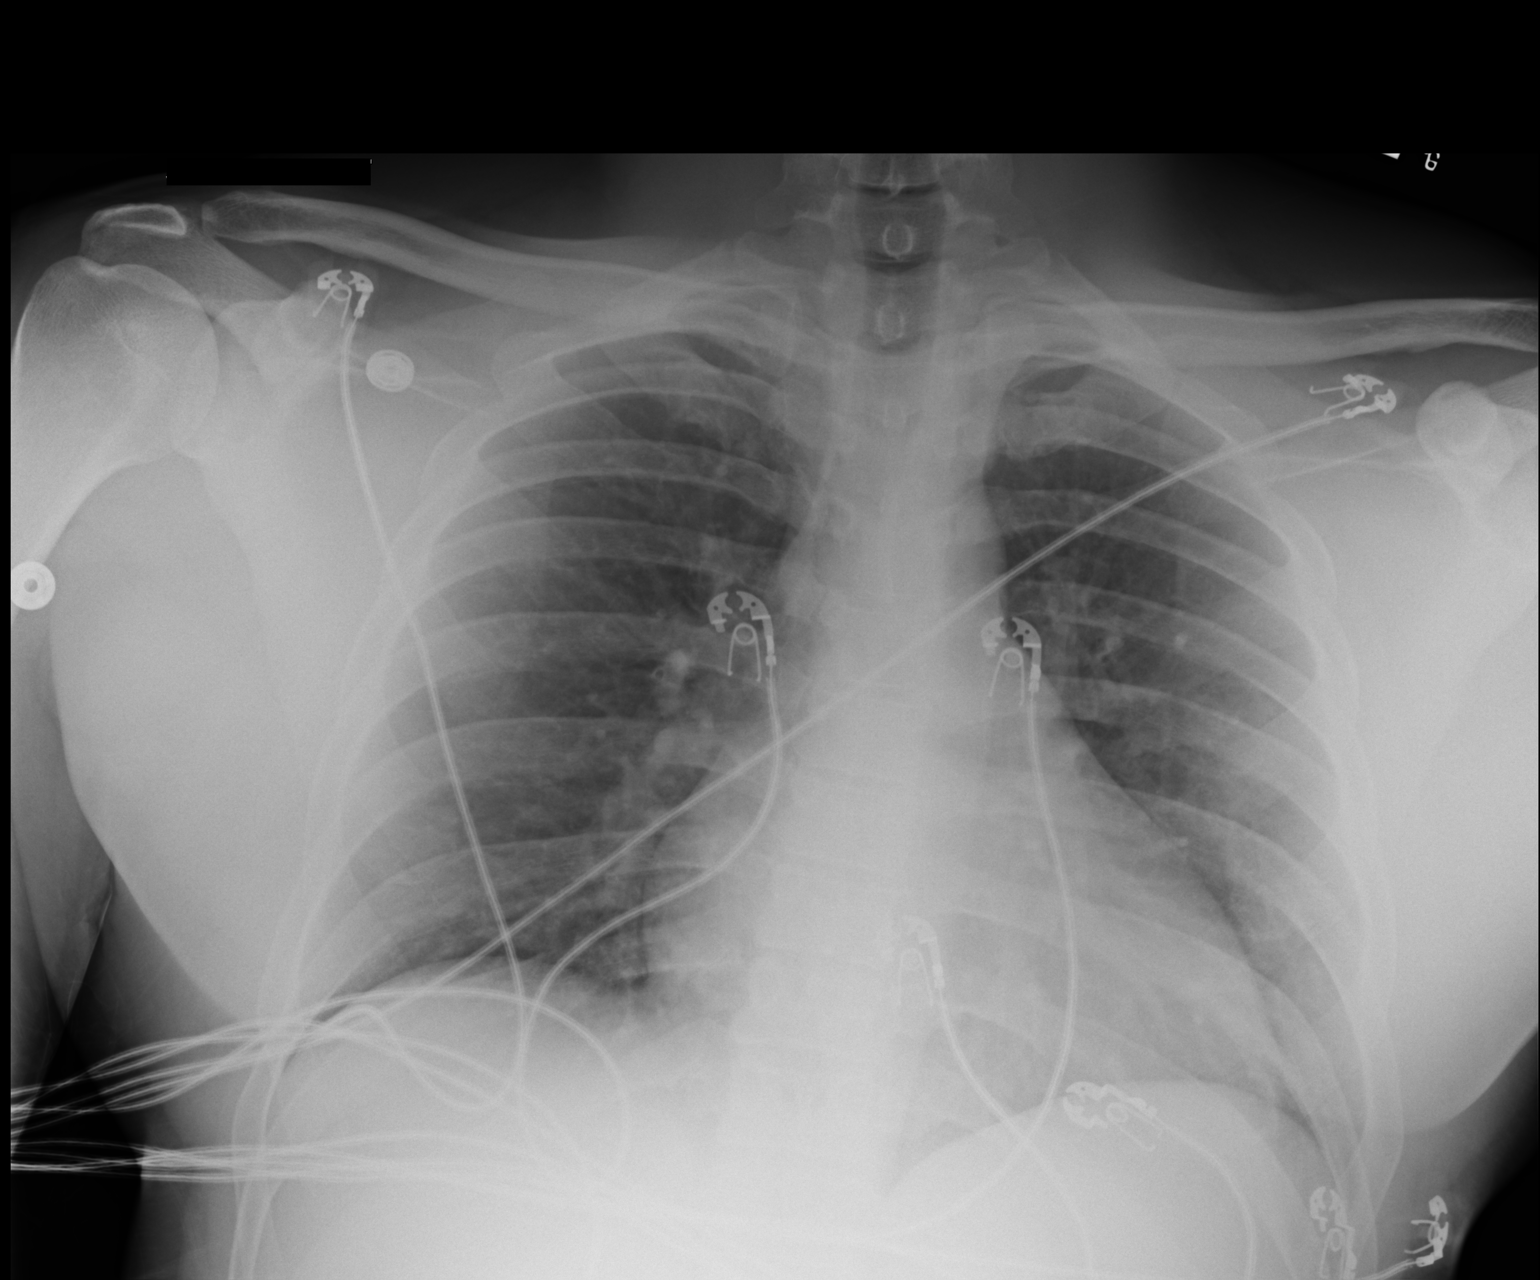

[1 of 1 positions shown; findings below may reference images not displayed]

FINDINGS: The lungs are well-aerated and clear. There is no evidence of focal
opacification, pleural effusion or pneumothorax.

The cardiomediastinal silhouette is borderline normal in size. No
acute osseous abnormalities are seen.
IMPRESSION: No acute cardiopulmonary process seen.

## 2016-04-17 ENCOUNTER — Ambulatory Visit (INDEPENDENT_AMBULATORY_CARE_PROVIDER_SITE_OTHER): Payer: BLUE CROSS/BLUE SHIELD | Admitting: Family Medicine

## 2016-04-17 DIAGNOSIS — M329 Systemic lupus erythematosus, unspecified: Secondary | ICD-10-CM | POA: Diagnosis not present
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
## 2016-04-28 DIAGNOSIS — I82409 Acute embolism and thrombosis of unspecified deep veins of unspecified lower extremity: Secondary | ICD-10-CM | POA: Insufficient documentation
